# Patient Record
Sex: Female | Born: 1970 | Race: Black or African American | Hispanic: No | Smoking: Never smoker
Health system: Southern US, Community
[De-identification: ages and names within clinical notes are randomized; demographics above are authoritative.]

## PROBLEM LIST (undated history)

## (undated) DIAGNOSIS — K8050 Calculus of bile duct without cholangitis or cholecystitis without obstruction: Secondary | ICD-10-CM

## (undated) DIAGNOSIS — G35 Multiple sclerosis: Secondary | ICD-10-CM

## (undated) DIAGNOSIS — M1610 Unilateral primary osteoarthritis, unspecified hip: Secondary | ICD-10-CM

## (undated) DIAGNOSIS — N83209 Unspecified ovarian cyst, unspecified side: Secondary | ICD-10-CM

## (undated) DIAGNOSIS — R32 Unspecified urinary incontinence: Secondary | ICD-10-CM

## (undated) DIAGNOSIS — D6859 Other primary thrombophilia: Secondary | ICD-10-CM

## (undated) DIAGNOSIS — N920 Excessive and frequent menstruation with regular cycle: Secondary | ICD-10-CM

## (undated) DIAGNOSIS — I00 Rheumatic fever without heart involvement: Secondary | ICD-10-CM

## (undated) DIAGNOSIS — J45909 Unspecified asthma, uncomplicated: Secondary | ICD-10-CM

## (undated) DIAGNOSIS — D508 Other iron deficiency anemias: Secondary | ICD-10-CM

## (undated) DIAGNOSIS — J301 Allergic rhinitis due to pollen: Secondary | ICD-10-CM

## (undated) DIAGNOSIS — G894 Chronic pain syndrome: Secondary | ICD-10-CM

## (undated) DIAGNOSIS — D497 Neoplasm of unspecified behavior of endocrine glands and other parts of nervous system: Secondary | ICD-10-CM

## (undated) DIAGNOSIS — K5900 Constipation, unspecified: Secondary | ICD-10-CM

## (undated) DIAGNOSIS — G609 Hereditary and idiopathic neuropathy, unspecified: Secondary | ICD-10-CM

## (undated) DIAGNOSIS — N910 Primary amenorrhea: Secondary | ICD-10-CM

## (undated) DIAGNOSIS — E079 Disorder of thyroid, unspecified: Secondary | ICD-10-CM

## (undated) DIAGNOSIS — G379 Demyelinating disease of central nervous system, unspecified: Secondary | ICD-10-CM

## (undated) DIAGNOSIS — M069 Rheumatoid arthritis, unspecified: Secondary | ICD-10-CM

## (undated) DIAGNOSIS — D573 Sickle-cell trait: Secondary | ICD-10-CM

## (undated) DIAGNOSIS — G629 Polyneuropathy, unspecified: Secondary | ICD-10-CM

## (undated) DIAGNOSIS — G253 Myoclonus: Secondary | ICD-10-CM

## (undated) DIAGNOSIS — M1990 Unspecified osteoarthritis, unspecified site: Secondary | ICD-10-CM

## (undated) DIAGNOSIS — M3500 Sicca syndrome, unspecified: Secondary | ICD-10-CM

## (undated) DIAGNOSIS — E039 Hypothyroidism, unspecified: Secondary | ICD-10-CM

## (undated) DIAGNOSIS — K8020 Calculus of gallbladder without cholecystitis without obstruction: Secondary | ICD-10-CM

## (undated) DIAGNOSIS — Z96641 Presence of right artificial hip joint: Secondary | ICD-10-CM

## (undated) HISTORY — DX: Excessive and frequent menstruation with regular cycle: N92.0

## (undated) HISTORY — DX: Rheumatoid arthritis, unspecified: M06.9

## (undated) HISTORY — DX: Sickle-cell trait: D57.3

## (undated) HISTORY — DX: Myoclonus: G25.3

## (undated) HISTORY — DX: Other primary thrombophilia: D68.59

## (undated) HISTORY — DX: Hereditary and idiopathic neuropathy, unspecified: G60.9

## (undated) HISTORY — DX: Hypothyroidism, unspecified: E03.9

## (undated) HISTORY — DX: Rheumatic fever without heart involvement: I00

## (undated) HISTORY — DX: Presence of right artificial hip joint: Z96.641

## (undated) HISTORY — DX: Other iron deficiency anemias: D50.8

## (undated) HISTORY — DX: Calculus of gallbladder without cholecystitis without obstruction: K80.20

## (undated) HISTORY — DX: Unspecified urinary incontinence: R32

## (undated) HISTORY — PX: TUBAL LIGATION: SHX77

## (undated) HISTORY — DX: Unspecified asthma, uncomplicated: J45.909

## (undated) HISTORY — DX: Demyelinating disease of central nervous system, unspecified: G37.9

## (undated) HISTORY — DX: Chronic pain syndrome: G89.4

## (undated) HISTORY — DX: Unilateral primary osteoarthritis, unspecified hip: M16.10

## (undated) HISTORY — DX: Primary amenorrhea: N91.0

## (undated) HISTORY — DX: Allergic rhinitis due to pollen: J30.1

## (undated) HISTORY — DX: Neoplasm of unspecified behavior of endocrine glands and other parts of nervous system: D49.7

## (undated) HISTORY — DX: Constipation, unspecified: K59.00

## (undated) HISTORY — DX: Sjogren syndrome, unspecified: M35.00

---

## 2011-10-15 ENCOUNTER — Emergency Department (HOSPITAL_COMMUNITY)
Admission: EM | Admit: 2011-10-15 | Discharge: 2011-10-15 | Disposition: A | Discharge status: Home / Self Care | Payer: Medicare PPO | Attending: Emergency Medicine | Admitting: Emergency Medicine

## 2011-10-15 ENCOUNTER — Encounter (HOSPITAL_COMMUNITY): Payer: Self-pay | Admitting: *Deleted

## 2011-10-15 ENCOUNTER — Emergency Department (HOSPITAL_COMMUNITY): Payer: Medicare PPO

## 2011-10-15 DIAGNOSIS — H539 Unspecified visual disturbance: Secondary | ICD-10-CM

## 2011-10-15 DIAGNOSIS — G9389 Other specified disorders of brain: Secondary | ICD-10-CM | POA: Insufficient documentation

## 2011-10-15 DIAGNOSIS — H5340 Unspecified visual field defects: Secondary | ICD-10-CM | POA: Insufficient documentation

## 2011-10-15 DIAGNOSIS — M3500 Sicca syndrome, unspecified: Secondary | ICD-10-CM | POA: Insufficient documentation

## 2011-10-15 DIAGNOSIS — M069 Rheumatoid arthritis, unspecified: Secondary | ICD-10-CM | POA: Insufficient documentation

## 2011-10-15 DIAGNOSIS — H538 Other visual disturbances: Secondary | ICD-10-CM | POA: Insufficient documentation

## 2011-10-15 HISTORY — DX: Disorder of thyroid, unspecified: E07.9

## 2011-10-15 HISTORY — DX: Unspecified osteoarthritis, unspecified site: M19.90

## 2011-10-15 HISTORY — DX: Polyneuropathy, unspecified: G62.9

## 2011-10-15 LAB — POCT I-STAT, CHEM 8
Creatinine, Ser: 0.8 mg/dL (ref 0.50–1.10)
HCT: 37 % (ref 36.0–46.0)
Hemoglobin: 12.6 g/dL (ref 12.0–15.0)
Potassium: 3.9 mEq/L (ref 3.5–5.1)
Sodium: 144 mEq/L (ref 135–145)

## 2011-10-15 MED ORDER — GADOBENATE DIMEGLUMINE 529 MG/ML IV SOLN
20.0000 mL | Freq: Once | INTRAVENOUS | Status: AC | PRN
  Administered 2011-10-15: 20 mL via INTRAVENOUS

## 2011-10-15 NOTE — ED Notes (Signed)
Neurology at bedside.

## 2011-10-15 NOTE — ED Provider Notes (Signed)
Pt w/ h/o Sjogrens and RA presents w/ persistent L central vision loss, first noticed upon waking 5 days ago.  Has had vision changes in the past but usually resolve spontaneously.  Evaluated by ophtho today and referred to ER for MRI to look for extraocular etiology.  Pt A&O and in NAD.  Eating dinner.  MRI pending.  6:34 PM   Mr Laqueta Jean Wo Contrast  10/15/2011  *RADIOLOGY REPORT*  Clinical Data: The worst areas of right central field deficit.  The patient states she has a history of left visual field disturbances. She has been having blurred vision for 5 days.  MRI HEAD WITHOUT AND WITH CONTRAST  Technique:  Multiplanar, multiecho pulse sequences of the brain and surrounding structures were obtained according to standard protocol without and with intravenous contrast  Contrast: 20mL MULTIHANCE GADOBENATE DIMEGLUMINE 529 MG/ML IV SOLN  Comparison: None available.  Findings: A right periventricular white matter hyperintensity measures 8 mm.  There is a rounded T2 hyperintensity in the right middle cerebellar peduncle measuring the 6 mm.  A lacunar infarct of the right cerebellum is also evident. Incidental note is made of a persistent cavum septum pellucidum.  The postcontrast images demonstrate no pathologic enhancement. Specifically, the white matter lesions do not enhance.  The diffusion weighted images demonstrate some T2 shine through at both lesions.  No definite restricted effusion is evident.  No hemorrhage or mass lesion is present.  IMPRESSION:  1.  To separate T2 hyperintense lesions are present one is adjacent to the right lateral ventricle, the other is in the right middle cerebellar peduncle.  Given the patient's symptoms and demographics, these are most concerning for a demyelinating lesion. Consider multiple sclerosis.   However, the findings are nonspecific and can also be seen in the setting of chronic microvascular ischemia, vasculitis, complicated migraine headaches, or as the sequelae of a  prior infectious or inflammatory process. 2.  Focal lesion of the right cerebellum more posteriorly appears represent a remote lacunar infarct.  Original Report Authenticated By: Jamesetta Orleans. MATTERN, M.D.   Neurology consulted and had seen pt in ED.  They would like pt to follow up with [facility] w/in the next 7 days and contact her office if they will not see her during that timeframe.  Plan discussed w/ pt.    Otilio Miu, [place] 10/16/11 1329

## 2011-10-15 NOTE — Consult Note (Signed)
Reason for Consult:Vision changes Referring Physician: Bebe Shaggy  CC: Central vision loss in the left eye  HPI: Sara Hayes is an 41 y.o. female with a history of RA on Embrel who reports that on Saturday she awakened with changes in her vision on the left eye.  Sometimes she has blurry vision with the [facility] and decided to wait to see if it would go away.  Her vision problems were still there on Sunday and she began to call her doctors at that time.  They advised her to stop her Embrel.  Her symptoms continuedand she presented here for evaluation.  She describes central vision loss in th left eye.  MRI of the brain was performed and showed two demyelinating lesions.    Past Medical History  Diagnosis Date  . Neuropathy   . Thyroid disease   . Arthritis     History reviewed. No pertinent past surgical history.  No family history on file.  Social History:  does not have a smoking history on file. She does not have any smokeless tobacco history on file. Her alcohol and drug histories not on file.  Allergies  Allergen Reactions  . Aspirin Other (See Comments)    unknown  . Naproxen Other (See Comments)    unknown  . Other Other (See Comments)    Amoxicillin: unknown  . Penicillins Other (See Comments)    unknown    Medications: I have reviewed the patient's current medications. Prior to Admission:  Tegretol, Vitamin D, Folvite, Neurontin, Synthroid, Methotrexate, Singulair, MVI, Pamelor   ROS: History obtained from the patient  General ROS: negative for - chills, fatigue, fever, night sweats, weight gain or weight loss Psychological ROS: negative for - behavioral disorder, hallucinations, memory difficulties, mood swings or suicidal ideation Ophthalmic ROS: see HPI ENT ROS: negative for - epistaxis, nasal discharge, oral lesions, sore throat, tinnitus or vertigo Allergy and Immunology ROS: negative for - hives or itchy/watery eyes Hematological and Lymphatic ROS: negative  for - bleeding problems, bruising or swollen lymph nodes Endocrine ROS: negative for - galactorrhea, hair pattern changes, polydipsia/polyuria or temperature intolerance Respiratory ROS: negative for - cough, hemoptysis, shortness of breath or wheezing Cardiovascular ROS: negative for - chest pain, dyspnea on exertion, edema or irregular heartbeat Gastrointestinal ROS: negative for - abdominal pain, diarrhea, hematemesis, nausea/vomiting or stool incontinence Genito-Urinary ROS: negative for - dysuria, hematuria, incontinence or urinary frequency/urgency Musculoskeletal ROS: joint pain Neurological ROS: as noted in HPI Dermatological ROS: negative for rash and skin lesion changes Physical Examination: Blood pressure 125/82, pulse 78, temperature 97.6 F (36.4 C), temperature source Oral, resp. rate 18, SpO2 99.00%.  Neurologic Examination Mental Status: Alert, oriented, thought content appropriate.  Speech fluent without evidence of aphasia.  Able to follow 3 step commands without difficulty. Cranial Nerves: II: Pupils equal, round, reactive to light and accommodation III,IV, VI: ptosis not present, extra-ocular motions intact bilaterally V,VII: smile symmetric, facial light touch sensation normal bilaterally VIII: hearing normal bilaterally IX,X: gag reflex present XI: trapezius strength/neck flexion strength normal bilaterally XII: tongue strength normal  Motor: Right : Upper extremity   5/5    Left:     Upper extremity   5/5  Lower extremity   5/5     Lower extremity   5/5 Tone and bulk:normal tone throughout; no atrophy noted Sensory: Pinprick and light touch intact throughout, bilaterally Deep Tendon Reflexes: 1+ in the upper extremities and absent in the lower extremities Plantars: Right: downgoing   Left: downgoing Cerebellar:  normal finger-to-nose and normal heel-to-shin test  Results for orders placed during the hospital encounter of 10/15/11 (from the past 48 hour(s))    POCT I-STAT, CHEM 8     Status: Normal   Collection Time   10/15/11  4:52 PM      Component Value Range Comment   Sodium 144  135 - 145 (mEq/L)    Potassium 3.9  3.5 - 5.1 (mEq/L)    Chloride 102  96 - 112 (mEq/L)    BUN 7  6 - 23 (mg/dL)    Creatinine, Ser 4.09  0.50 - 1.10 (mg/dL)    Glucose, Bld 89  70 - 99 (mg/dL)    Calcium, Ion 8.11  1.12 - 1.32 (mmol/L)    TCO2 30  0 - 100 (mmol/L)    Hemoglobin 12.6  12.0 - 15.0 (g/dL)    HCT 91.4  78.2 - 95.6 (%)     No results found for this or any previous visit (from the past 240 hour(s)).  Mr Laqueta Jean Wo Contrast  10/15/2011  *RADIOLOGY REPORT*  Clinical Data: The worst areas of right central field deficit.  The patient states she has a history of left visual field disturbances. She has been having blurred vision for 5 days.  MRI HEAD WITHOUT AND WITH CONTRAST  Technique:  Multiplanar, multiecho pulse sequences of the brain and surrounding structures were obtained according to standard protocol without and with intravenous contrast  Contrast: 20mL MULTIHANCE GADOBENATE DIMEGLUMINE 529 MG/ML IV SOLN  Comparison: None available.  Findings: A right periventricular white matter hyperintensity measures 8 mm.  There is a rounded T2 hyperintensity in the right middle cerebellar peduncle measuring the 6 mm.  A lacunar infarct of the right cerebellum is also evident. Incidental note is made of a persistent cavum septum pellucidum.  The postcontrast images demonstrate no pathologic enhancement. Specifically, the white matter lesions do not enhance.  The diffusion weighted images demonstrate some T2 shine through at both lesions.  No definite restricted effusion is evident.  No hemorrhage or mass lesion is present.  IMPRESSION:  1.  To separate T2 hyperintense lesions are present one is adjacent to the right lateral ventricle, the other is in the right middle cerebellar peduncle.  Given the patient's symptoms and demographics, these are most concerning for a  demyelinating lesion. Consider multiple sclerosis.   However, the findings are nonspecific and can also be seen in the setting of chronic microvascular ischemia, vasculitis, complicated migraine headaches, or as the sequelae of a prior infectious or inflammatory process. 2.  Focal lesion of the right cerebellum more posteriorly appears represent a remote lacunar infarct.  Original Report Authenticated By: Jamesetta Orleans. MATTERN, M.D.     Assessment/Plan:  Patient Active Hospital Problem List: Visual changes   Assessment:  Patient with vision changes that are likely secondary to the demyelinating lesion on the MR imaging.  There have been reported cases on MS on Embrel and it is likely that this is what the patient is experiencing.  She has already been told to discontinue her Embrel.  Her symptoms have been present for the past 5 days and have not been progressive.  Her lesions are not enhancing.     Plan:  1. Patient may be followed up on an outpatient basis by Neurology.  Will need to be seen within a week.    Thana Farr, MD [facility] [phone] 10/15/2011, 9:53 PM

## 2011-10-15 NOTE — ED Notes (Signed)
Sent to ed from eye md's office for eval of grey spot in left eye

## 2011-10-15 NOTE — ED Provider Notes (Signed)
BP 129/89  Pulse 94  Temp(Src) 98.3 F (36.8 C) (Oral)  SpO2 98% Pt seen with resident Pt here with visual change in left eye, already seen by ophthalmology No other focal deficits reported/noted on exam Will obtain MRI with/without infusion per rads recommendation   Joya Gaskins, MD 10/15/11 1642

## 2011-10-15 NOTE — ED Notes (Signed)
MD at bedside. PT in EYE ROOM

## 2011-10-15 NOTE — ED Notes (Addendum)
Report received, assumed care. Pt ambulatory to CDU 3.

## 2011-10-15 NOTE — ED Provider Notes (Signed)
History     CSN: [id number]  Arrival date & time 10/15/11  1446   First MD Initiated Contact with Patient 10/15/11 1506      Chief Complaint  Patient presents with  . Eye Problem    (Consider location/radiation/quality/duration/timing/severity/associated sxs/prior treatment) HPI history from patient Patient sent from ophthalmology office for further evaluation and possible MRI  Patient is a 41 year old female with a history of rheumatoid arthritis and Sjogren's disease (on methotrexate and Enbrel) who presents with left vision change. This started approximately 4-5 days ago. It was abrupt in onset and has been constant. There been no modifying factors noted. Patient reports visual change as a loss of central vision. She describes it they film or covering that prevents her from seeing things straight ahead. She does not have specific upper field deficits. She has had no diplopia. She denies extremity weakness or sensation changes. She is still able to ambulate well at baseline. She has had no headache. She has also not had any recent fever, chills, other infectious symptoms. She has never had this problem before. She has been on Enbrel for about one year. No significant changes in her other medications. She saw ophthalmologist today who evaluated her. Per patient's report, ocular exam was normal. Transferred here for additional evaluation and possible MRI  To rule out extraocular etiology.  Overall severity moderate.  Past Medical History  Diagnosis Date  . Neuropathy   . Thyroid disease   . Arthritis     History reviewed. No pertinent past surgical history.  No family history on file.  History  Substance Use Topics  . Smoking status: Not on file  . Smokeless tobacco: Not on file  . Alcohol Use:     OB History    Grav Para Term Preterm Abortions TAB SAB Ect Mult Living                  Review of Systems  Constitutional: Negative for fever and chills.  HENT: Negative for  facial swelling.   Respiratory: Negative for cough, chest tightness, shortness of breath and wheezing.   Cardiovascular: Negative for chest pain.  Gastrointestinal: Negative for nausea, vomiting, abdominal pain and diarrhea.  Genitourinary: Negative for difficulty urinating.  Skin: Negative for rash.  Neurological: Negative for dizziness, speech difficulty, weakness, light-headedness, numbness and headaches.  Psychiatric/Behavioral: Negative for behavioral problems and confusion.  All other systems reviewed and are negative.    Allergies  Aspirin; Naproxen; Other; and Penicillins  Home Medications   Current Outpatient Rx  Name Route Sig Dispense Refill  . CARBAMAZEPINE 200 MG PO TABS Oral Take 200 mg by mouth 2 (two) times daily.    Marland Kitchen VITAMIN D PO Oral Take 1 tablet by mouth daily.    Marland Kitchen FOLIC ACID 1 MG PO TABS Oral Take 3 mg by mouth daily.    Marland Kitchen GABAPENTIN 300 MG PO CAPS Oral Take 900 mg by mouth 3 (three) times daily.    Marland Kitchen LEVOTHYROXINE SODIUM 100 MCG PO TABS Oral Take 100 mcg by mouth daily.    Marland Kitchen METHOTREXATE SODIUM 2.5 MG PO TABS Oral Take 25 mg by mouth 3 (three) times a week.    Marland Kitchen MONTELUKAST SODIUM 10 MG PO TABS Oral Take 10 mg by mouth at bedtime.    Carma Leaven M PLUS PO TABS Oral Take 1 tablet by mouth daily.    Marland Kitchen NORTRIPTYLINE HCL 25 MG PO CAPS Oral Take 75 mg by mouth at bedtime.  BP 118/60  Pulse 91  Temp(Src) 97.9 F (36.6 C) (Oral)  Resp 20  SpO2 100%  Physical Exam  Nursing note and vitals reviewed. Constitutional: She is oriented to person, place, and time. She appears well-developed and well-nourished. No distress.  HENT:  Head: Normocephalic.  Mouth/Throat: Oropharynx is clear and moist.  Eyes: EOM are normal. Pupils are equal, round, and reactive to light. No scleral icterus.  Neck: Normal range of motion. Neck supple.       No bruits  Cardiovascular: Normal rate, regular rhythm and intact distal pulses.   Pulmonary/Chest: Effort normal. No  respiratory distress. She has no wheezes. She has no rales.  Abdominal: Soft. She exhibits no distension. There is no tenderness.  Musculoskeletal: Normal range of motion. She exhibits no edema and no tenderness.  Neurological: She is alert and oriented to person, place, and time. She has normal strength. No cranial nerve deficit or sensory deficit. She exhibits normal muscle tone. Coordination normal. GCS eye subscore is 4. GCS verbal subscore is 5. GCS motor subscore is 6.       No pronator drift Normal ambulation  No specific visual field cut her deficit. Patient does have subjective central visual loss in left eye. Extraocular movements intact.  Cranial nerves all intact. Normal coordination.  Normal strength throughout  Skin: Skin is warm and dry. No rash noted. She is not diaphoretic.  Psychiatric: She has a normal mood and affect. Her behavior is normal. Thought content normal.    ED Course  Procedures (including critical care time)   Labs Reviewed  POCT I-STAT, CHEM 8  I-STAT, CHEM 8   Mr Laqueta Jean Wo Contrast  10/15/2011  *RADIOLOGY REPORT*  Clinical Data: The worst areas of right central field deficit.  The patient states she has a history of left visual field disturbances. She has been having blurred vision for 5 days.  MRI HEAD WITHOUT AND WITH CONTRAST  Technique:  Multiplanar, multiecho pulse sequences of the brain and surrounding structures were obtained according to standard protocol without and with intravenous contrast  Contrast: 20mL MULTIHANCE GADOBENATE DIMEGLUMINE 529 MG/ML IV SOLN  Comparison: None available.  Findings: A right periventricular white matter hyperintensity measures 8 mm.  There is a rounded T2 hyperintensity in the right middle cerebellar peduncle measuring the 6 mm.  A lacunar infarct of the right cerebellum is also evident. Incidental note is made of a persistent cavum septum pellucidum.  The postcontrast images demonstrate no pathologic enhancement.  Specifically, the white matter lesions do not enhance.  The diffusion weighted images demonstrate some T2 shine through at both lesions.  No definite restricted effusion is evident.  No hemorrhage or mass lesion is present.  IMPRESSION:  1.  To separate T2 hyperintense lesions are present one is adjacent to the right lateral ventricle, the other is in the right middle cerebellar peduncle.  Given the patient's symptoms and demographics, these are most concerning for a demyelinating lesion. Consider multiple sclerosis.   However, the findings are nonspecific and can also be seen in the setting of chronic microvascular ischemia, vasculitis, complicated migraine headaches, or as the sequelae of a prior infectious or inflammatory process. 2.  Focal lesion of the right cerebellum more posteriorly appears represent a remote lacunar infarct.  Original Report Authenticated By: Jamesetta Orleans. MATTERN, M.D.     1. Vision changes       MDM   Patient here with new onset neurologic deficit described as central vision loss in left  eye. She is transferred here from ophthalmology after reportedly having a normal ocular exam. She will need MRI. Because of concern for possible demyelinating disease, will obtain MRI with and without contrast. Patient does not have significant risk factors for stroke in her clinical picture is not suggestive of acute stroke. She otherwise is well appearing with unremarkable neuro exam.  Patient transferred to CDU with MRI pending. CDU provider will followup results and aid in final disposition.        Milus Glazier 10/16/11 0106

## 2011-10-15 NOTE — ED Notes (Signed)
Pt seen by eye Dr and sent here for further eval of pituitary tumor.

## 2011-10-15 NOTE — ED Notes (Signed)
Patient transported to MRI 

## 2011-10-15 NOTE — ED Notes (Signed)
Ordered Psychologist, forensic Regular

## 2011-10-16 NOTE — ED Provider Notes (Signed)
I have personally seen and examined the patient.  I have discussed the plan of care with the resident.  I have reviewed the documentation on PMH/FH/Soc. History.  I have reviewed the documentation of the resident and agree.   Joya Gaskins, MD 10/16/11 2356

## 2011-10-16 NOTE — ED Provider Notes (Signed)
Medical screening examination/treatment/procedure(s) were performed by non-physician practitioner and as supervising physician I was immediately available for consultation/collaboration.   Joya Gaskins, MD 10/16/11 2356

## 2012-06-03 ENCOUNTER — Other Ambulatory Visit: Payer: Self-pay | Admitting: Internal Medicine

## 2012-06-03 DIAGNOSIS — Z1231 Encounter for screening mammogram for malignant neoplasm of breast: Secondary | ICD-10-CM

## 2012-06-18 ENCOUNTER — Ambulatory Visit: Payer: Medicare PPO

## 2013-02-07 ENCOUNTER — Emergency Department (HOSPITAL_COMMUNITY): Payer: Medicare PPO

## 2013-02-07 ENCOUNTER — Encounter (HOSPITAL_COMMUNITY): Payer: Self-pay | Admitting: *Deleted

## 2013-02-07 ENCOUNTER — Emergency Department (HOSPITAL_COMMUNITY)
Admission: EM | Admit: 2013-02-07 | Discharge: 2013-02-07 | Disposition: A | Discharge status: Home / Self Care | Payer: Medicare PPO | Attending: Emergency Medicine | Admitting: Emergency Medicine

## 2013-02-07 DIAGNOSIS — Z8742 Personal history of other diseases of the female genital tract: Secondary | ICD-10-CM | POA: Insufficient documentation

## 2013-02-07 DIAGNOSIS — Z880 Allergy status to penicillin: Secondary | ICD-10-CM | POA: Insufficient documentation

## 2013-02-07 DIAGNOSIS — G589 Mononeuropathy, unspecified: Secondary | ICD-10-CM | POA: Insufficient documentation

## 2013-02-07 DIAGNOSIS — Z975 Presence of (intrauterine) contraceptive device: Secondary | ICD-10-CM | POA: Insufficient documentation

## 2013-02-07 DIAGNOSIS — M129 Arthropathy, unspecified: Secondary | ICD-10-CM | POA: Insufficient documentation

## 2013-02-07 DIAGNOSIS — R102 Pelvic and perineal pain: Secondary | ICD-10-CM

## 2013-02-07 DIAGNOSIS — E079 Disorder of thyroid, unspecified: Secondary | ICD-10-CM | POA: Insufficient documentation

## 2013-02-07 DIAGNOSIS — R1032 Left lower quadrant pain: Secondary | ICD-10-CM | POA: Insufficient documentation

## 2013-02-07 DIAGNOSIS — Z79899 Other long term (current) drug therapy: Secondary | ICD-10-CM | POA: Insufficient documentation

## 2013-02-07 DIAGNOSIS — Z3202 Encounter for pregnancy test, result negative: Secondary | ICD-10-CM | POA: Insufficient documentation

## 2013-02-07 DIAGNOSIS — N949 Unspecified condition associated with female genital organs and menstrual cycle: Secondary | ICD-10-CM | POA: Insufficient documentation

## 2013-02-07 HISTORY — DX: Unspecified ovarian cyst, unspecified side: N83.209

## 2013-02-07 LAB — CBC WITH DIFFERENTIAL/PLATELET
Hemoglobin: 11.7 g/dL — ABNORMAL LOW (ref 12.0–15.0)
Lymphocytes Relative: 26 % (ref 12–46)
Lymphs Abs: 1.5 10*3/uL (ref 0.7–4.0)
MCH: 29.8 pg (ref 26.0–34.0)
Monocytes Relative: 11 % (ref 3–12)
Neutro Abs: 3.7 10*3/uL (ref 1.7–7.7)
Neutrophils Relative %: 63 % (ref 43–77)
RBC: 3.92 MIL/uL (ref 3.87–5.11)
WBC: 5.8 10*3/uL (ref 4.0–10.5)

## 2013-02-07 LAB — URINALYSIS, ROUTINE W REFLEX MICROSCOPIC
Bilirubin Urine: NEGATIVE
Hgb urine dipstick: NEGATIVE
Specific Gravity, Urine: 1.017 (ref 1.005–1.030)
pH: 6 (ref 5.0–8.0)

## 2013-02-07 LAB — URINE MICROSCOPIC-ADD ON

## 2013-02-07 LAB — POCT PREGNANCY, URINE: Preg Test, Ur: NEGATIVE

## 2013-02-07 LAB — WET PREP, GENITAL
Trich, Wet Prep: NONE SEEN
Yeast Wet Prep HPF POC: NONE SEEN

## 2013-02-07 LAB — BASIC METABOLIC PANEL
BUN: 7 mg/dL (ref 6–23)
CO2: 32 mEq/L (ref 19–32)
Chloride: 104 mEq/L (ref 96–112)
Glucose, Bld: 99 mg/dL (ref 70–99)
Potassium: 4.2 mEq/L (ref 3.5–5.1)

## 2013-02-07 MED ORDER — CEFTRIAXONE SODIUM 250 MG IJ SOLR
250.0000 mg | Freq: Once | INTRAMUSCULAR | Status: AC
  Administered 2013-02-07: 250 mg via INTRAMUSCULAR
  Filled 2013-02-07: qty 250

## 2013-02-07 MED ORDER — DOXYCYCLINE HYCLATE 100 MG PO CAPS: 100.0000 mg | ORAL_CAPSULE | Freq: Two times a day (BID) | ORAL | Status: DC

## 2013-02-07 MED ORDER — ACETAMINOPHEN 325 MG PO TABS
975.0000 mg | ORAL_TABLET | Freq: Once | ORAL | Status: AC
  Administered 2013-02-07: 975 mg via ORAL
  Filled 2013-02-07: qty 3

## 2013-02-07 MED ORDER — HYDROCODONE-ACETAMINOPHEN 5-325 MG PO TABS: 1.0000 | ORAL_TABLET | Freq: Four times a day (QID) | ORAL | Status: DC | PRN

## 2013-02-07 MED ORDER — LIDOCAINE HCL (PF) 1 % IJ SOLN
INTRAMUSCULAR | Status: AC
  Administered 2013-02-07: 16:00:00
  Filled 2013-02-07: qty 5

## 2013-02-07 NOTE — ED Notes (Signed)
Pt is here with LLQ pain since 0400 and pt reports it happened again last nite

## 2013-02-07 NOTE — ED Provider Notes (Signed)
History     CSN: [id number]  Arrival date & time 02/07/13  1119   First MD Initiated Contact with Patient 02/07/13 1210      Chief Complaint  Patient presents with  . Abdominal Pain    (Consider location/radiation/quality/duration/timing/severity/associated sxs/prior treatment) HPI Patient is a 41 y.o AAF who presents to emergency department complaining of left lower abdominal pain.  Patient states the pain is sharp and constant.  Pain began last night but subsided after a short time.  This morning around 0400 the pain returned and has progressively worsened.  The patient denies fever, nausea, vomiting, diarrhea, dysuria, and hematuria.  Patient has IUD and noticed some spotting yesterday (this is normal for her) but no other vaginal bleeding.  She uses condoms and denies vaginal discharge.  She has an ovarian cyst but does not remember on which side it is located.  She has taken tylenol with minimal relief.   Past Medical History  Diagnosis Date  . Neuropathy   . Thyroid disease   . Arthritis   . Ovarian cyst     Past Surgical History  Procedure Laterality Date  . Tubal ligation      No family history on file.  History  Substance Use Topics  . Smoking status: Never Smoker   . Smokeless tobacco: Not on file  . Alcohol Use: No    OB History   Grav Para Term Preterm Abortions TAB SAB Ect Mult Living                  Review of Systems All other systems negative except as documented in the HPI. All pertinent positives and negatives as reviewed in the HPI.  Allergies  Aspirin; Naproxen; Other; and Penicillins  Home Medications   Current Outpatient Rx  Name  Route  Sig  Dispense  Refill  . carbamazepine (TEGRETOL) 200 MG tablet   Oral   Take 200 mg by mouth 2 (two) times daily.         . Cholecalciferol (VITAMIN D PO)   Oral   Take 1 tablet by mouth daily.         . folic acid (FOLVITE) 1 MG tablet   Oral   Take 3 mg by mouth daily.         Marland Kitchen  gabapentin (NEURONTIN) 300 MG capsule   Oral   Take 900 mg by mouth 3 (three) times daily.         Marland Kitchen levothyroxine (SYNTHROID, LEVOTHROID) 100 MCG tablet   Oral   Take 100 mcg by mouth daily.         . methotrexate 2.5 MG tablet   Oral   Take 25 mg by mouth 3 (three) times a week.         . montelukast (SINGULAIR) 10 MG tablet   Oral   Take 10 mg by mouth at bedtime.         . Multiple Vitamins-Minerals (MULTIVITAMINS THER. W/MINERALS) TABS   Oral   Take 1 tablet by mouth daily.         . nortriptyline (PAMELOR) 25 MG capsule   Oral   Take 75 mg by mouth at bedtime.           BP 130/80  Pulse 94  Temp(Src) 97.9 F (36.6 C) (Oral)  Resp 18  SpO2 100%  Physical Exam  Constitutional: She is oriented to person, place, and time. She appears well-developed and well-nourished.  HENT:  Head: Normocephalic  and atraumatic.  Mouth/Throat: Oropharynx is clear and moist.  Eyes: Conjunctivae are normal. Pupils are equal, round, and reactive to light.  Neck: Neck supple.  Cardiovascular: Normal rate, regular rhythm and normal heart sounds.  Exam reveals no friction rub.   No murmur heard. Pulmonary/Chest: Effort normal and breath sounds normal. No respiratory distress. She has no wheezes. She has no rales.  Abdominal: Soft. Bowel sounds are normal. There is tenderness in the left lower quadrant. There is guarding. There is no CVA tenderness.    Lymphadenopathy:    She has no cervical adenopathy.  Neurological: She is alert and oriented to person, place, and time.  Skin: Skin is warm and dry.  Psychiatric: She has a normal mood and affect.    ED Course  Procedures (including critical care time)  Labs Reviewed  CBC WITH DIFFERENTIAL - Abnormal; Notable for the following:    Hemoglobin 11.7 (*)    HCT 34.3 (*)    All other components within normal limits  URINALYSIS, ROUTINE W REFLEX MICROSCOPIC - Abnormal; Notable for the following:    Leukocytes, UA SMALL (*)     All other components within normal limits  URINE MICROSCOPIC-ADD ON - Abnormal; Notable for the following:    Squamous Epithelial / LPF FEW (*)    All other components within normal limits  BASIC METABOLIC PANEL  POCT PREGNANCY, URINE   Patient treated for possible STD.  Patient is referred back to her GYN doctor for further evaluation and care.  She told to return to the emergency department for any worsening in her condition  MDM          Carlyle Dolly, PA-C 02/11/13 0011

## 2013-02-07 NOTE — ED Notes (Signed)
Pain LLQ since yesterday. Hx of cyst, but unsure what side. Denies nausea/vomiting/diarrhea. No fever/chills. No vaginal discharge, but has had some intermittent spotting. Tender to LLQ on palpation. Took tylenol this am with some relief.

## 2013-02-08 LAB — GC/CHLAMYDIA PROBE AMP
CT Probe RNA: NEGATIVE
GC Probe RNA: NEGATIVE

## 2013-02-11 NOTE — ED Provider Notes (Signed)
Medical screening examination/treatment/procedure(s) were performed by non-physician practitioner and as supervising physician I was immediately available for consultation/collaboration.   Gilda Crease, MD 02/11/13 [phone]

## 2013-09-13 ENCOUNTER — Other Ambulatory Visit: Payer: Self-pay

## 2013-09-13 DIAGNOSIS — Z1231 Encounter for screening mammogram for malignant neoplasm of breast: Secondary | ICD-10-CM

## 2014-04-12 IMAGING — US US ART/VEN ABD/PELV/SCROTUM DOPPLER LTD
1 series · 13 of 25 positions shown · non-contrast
Comparison: None.

CLINICAL DATA: Left lower quadrant pelvic pain, clinical concern
for torsion

TRANSABDOMINAL AND TRANSVAGINAL ULTRASOUND OF PELVIS
DOPPLER ULTRASOUND OF OVARIES
TECHNIQUE: Both transabdominal and transvaginal ultrasound
examinations of the pelvis were performed. Transabdominal technique
was performed for global imaging of the pelvis including uterus,
ovaries, adnexal regions, and pelvic cul-de-sac.
It was necessary to proceed with endovaginal exam following the
transabdominal exam to visualize the endometrium and ovaries.
Color and duplex Doppler ultrasound was utilized to evaluate blood
flow to the ovaries.

[Series 1: us art/ven abd/pelv/scrotum doppler ltd · 0.28mm/px · 13 of 94 slices shown]
[im 1/94]
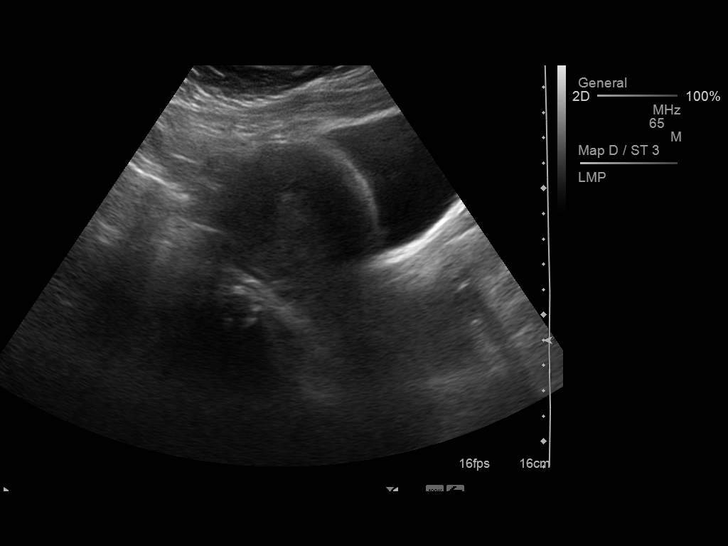
[im 8/94]
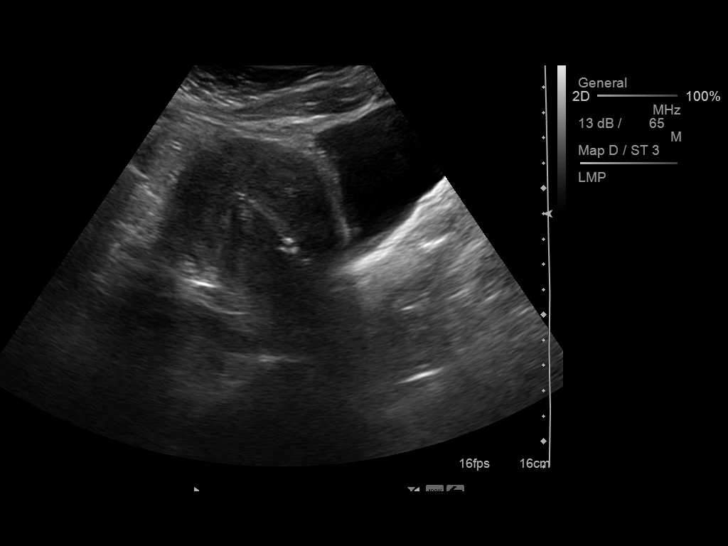
[im 16/94]
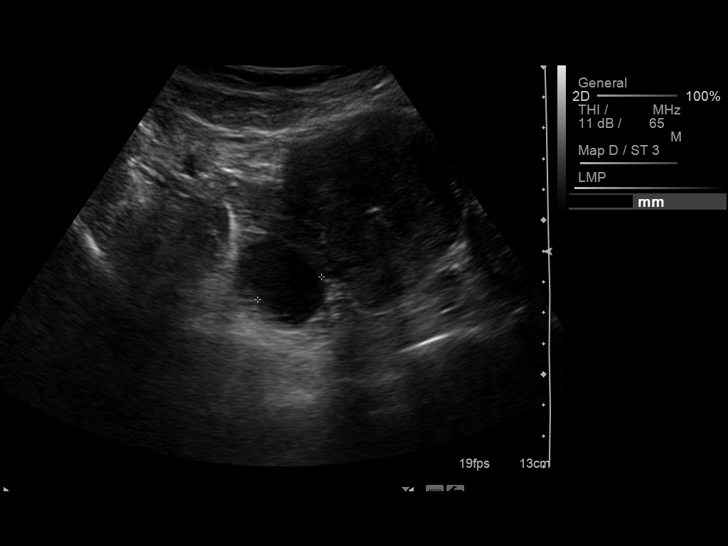
[im 24/94]
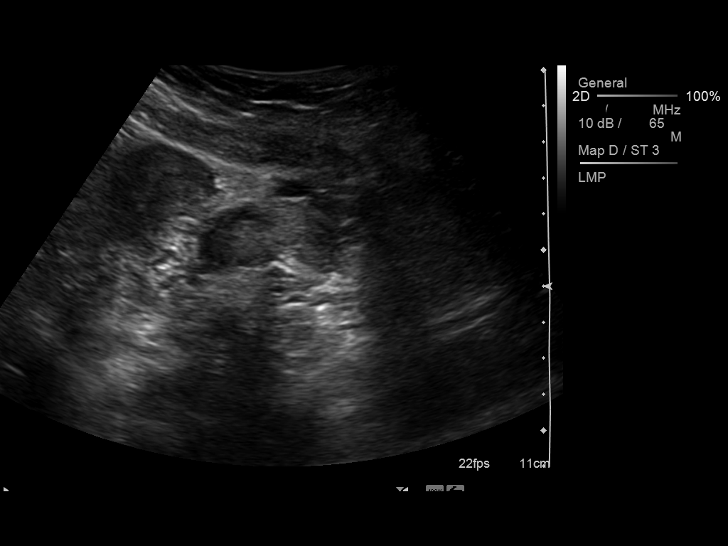
[im 32/94]
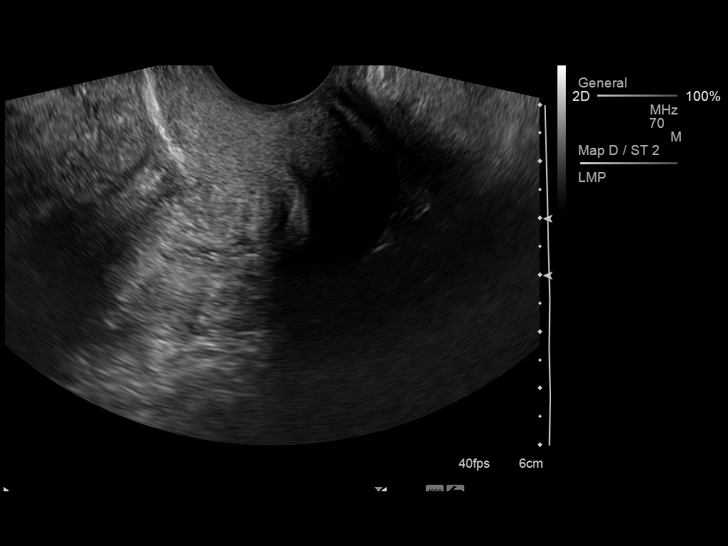
[im 39/94]
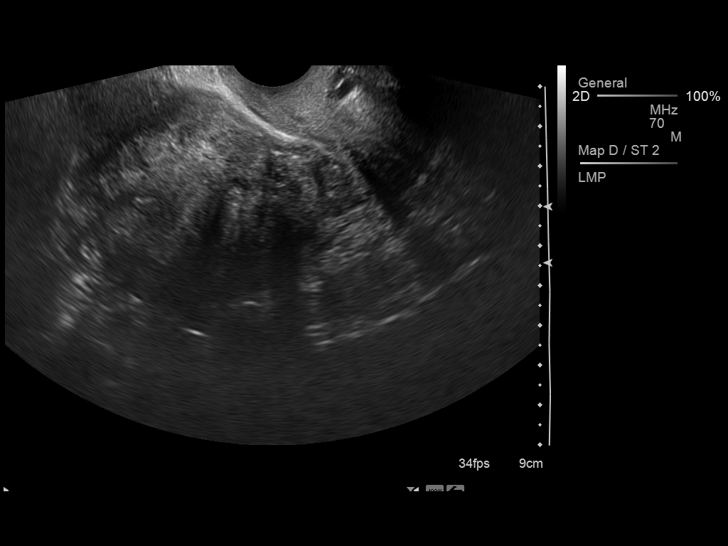
[im 47/94]
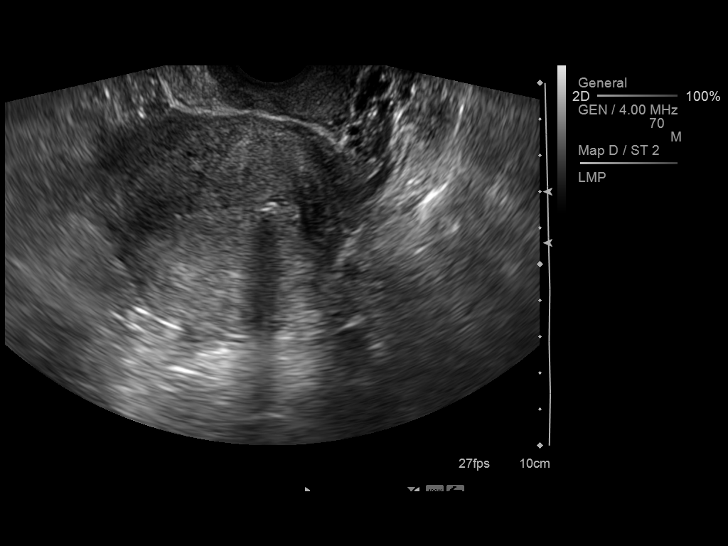
[im 55/94]
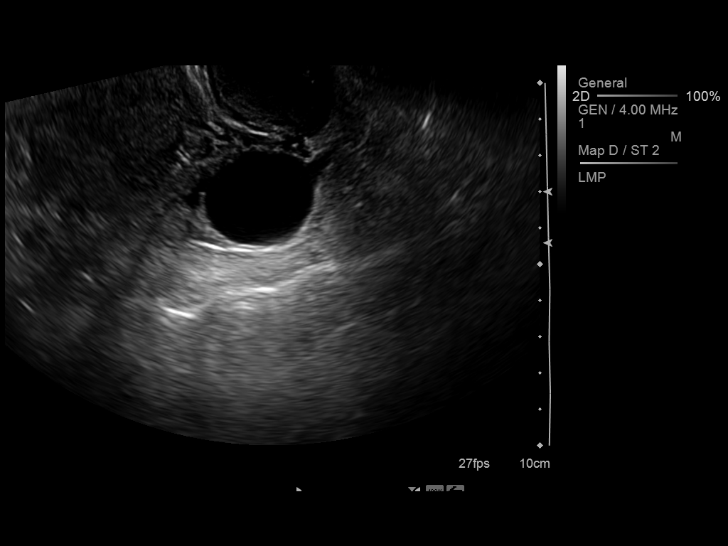
[im 63/94]
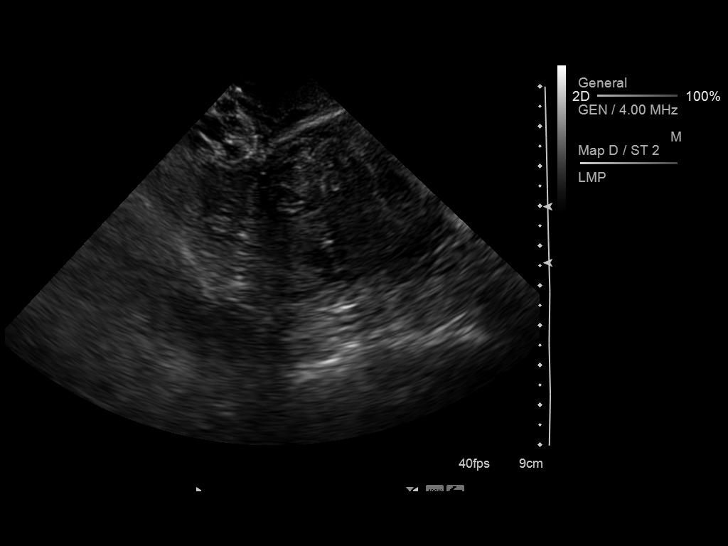
[im 70/94]
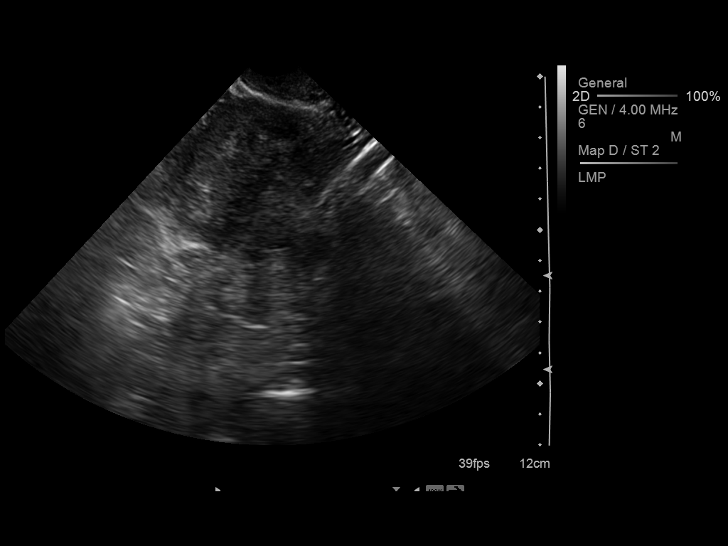
[im 78/94]
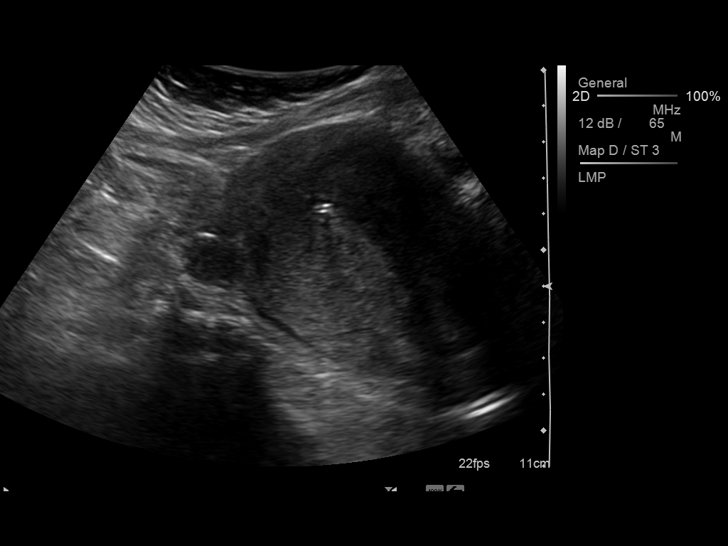
[im 86/94]
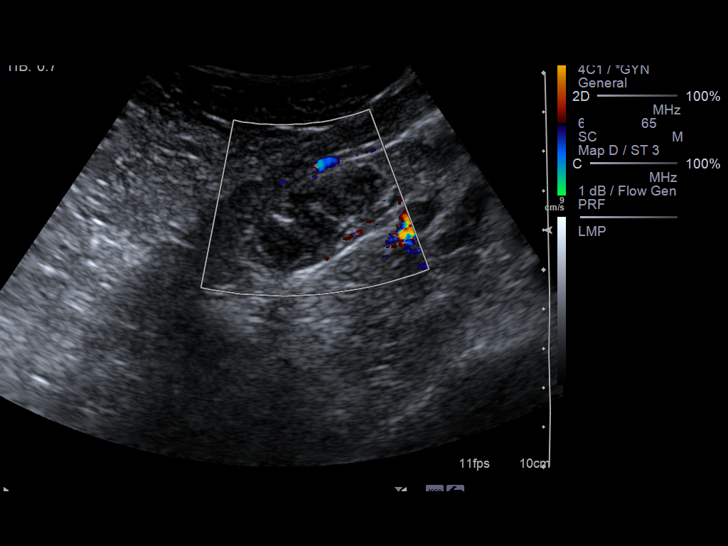
[im 94/94]
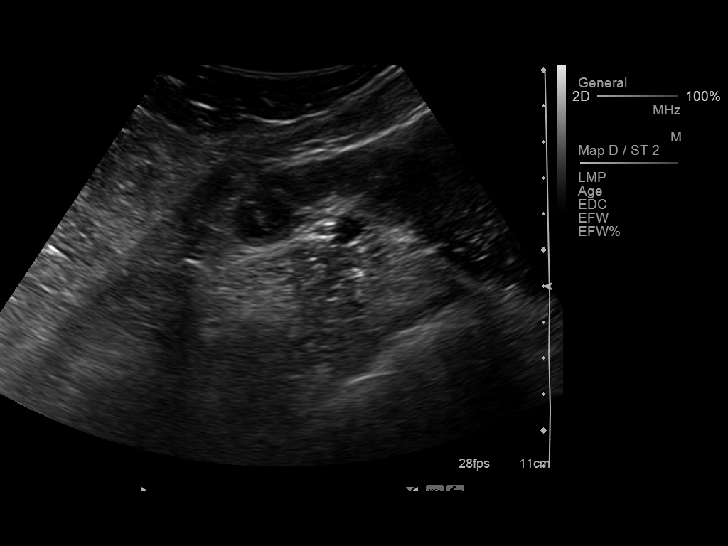

[13 of 25 positions shown; findings below may reference images not displayed]

FINDINGS: Uterus:  11.4 x 6.6 x 5.6 cm.  Anteverted, anteflexed.  No focal
abnormality.

Endometrium:  6 mm, uniformly thin where visualized.  Partly
obscured by IUD appropriately located within the uterine
fundal/body endometrial canal.

Right ovary: 4.5 x 2.4 x 2.8 cm.  Simple appearing cyst measures
3.4 x 2.9 x 2.4 cm.

Left ovary:   2.5 x 2.4 x 1.4 cm.  Normal. 1.8 x 1.4 x 1.2 cm
tubular anechoic left adnexal structure likely represents a
hydrosalpinx.  This is not well visualized, and a paraovarian cyst
could have a similar appearance.

Pulsed Doppler evaluation demonstrates normal low-resistance
arterial and venous waveforms in both ovaries.
IMPRESSION: No acute abnormality.  Possible small left hydrosalpinx or
paraovarian cyst incidentally noted.  Is there history of pelvic
inflammatory disease that could suggest tubo-ovarian abscess?

No sonographic evidence for ovarian torsion.

## 2014-08-15 ENCOUNTER — Other Ambulatory Visit (HOSPITAL_COMMUNITY): Payer: Self-pay | Admitting: Respiratory Therapy

## 2014-08-15 DIAGNOSIS — J45909 Unspecified asthma, uncomplicated: Secondary | ICD-10-CM

## 2014-08-18 ENCOUNTER — Other Ambulatory Visit: Payer: Self-pay

## 2014-08-18 DIAGNOSIS — Z1231 Encounter for screening mammogram for malignant neoplasm of breast: Secondary | ICD-10-CM

## 2014-08-22 ENCOUNTER — Ambulatory Visit (HOSPITAL_COMMUNITY)
Admission: RE | Admit: 2014-08-22 | Discharge: 2014-08-22 | Disposition: A | Discharge status: Home / Self Care | Payer: Medicare PPO | Source: Ambulatory Visit | Attending: Internal Medicine | Admitting: Internal Medicine

## 2014-08-22 DIAGNOSIS — J45909 Unspecified asthma, uncomplicated: Secondary | ICD-10-CM | POA: Diagnosis not present

## 2014-08-22 LAB — PULMONARY FUNCTION TEST
DL/VA % PRED: 116 %
DL/VA: 5.87 ml/min/mmHg/L
DLCO COR: 24.01 ml/min/mmHg
DLCO UNC % PRED: 89 %
DLCO cor % pred: 89 %
DLCO unc: 24.01 ml/min/mmHg
FEF 25-75 Pre: 4.02 L/sec
FEF2575-%Pred-Pre: 139 %
FEV1-%PRED-PRE: 101 %
FEV1-PRE: 2.72 L
FEV1FVC-%Pred-Pre: 108 %
FEV6-%PRED-PRE: 94 %
FEV6-Pre: 3.02 L
FEV6FVC-%PRED-PRE: 102 %
FVC-%Pred-Pre: 92 %
FVC-Pre: 3.02 L
PRE FEV1/FVC RATIO: 90 %
Pre FEV6/FVC Ratio: 100 %

## 2014-09-08 ENCOUNTER — Ambulatory Visit
Admission: RE | Admit: 2014-09-08 | Discharge: 2014-09-08 | Disposition: A | Discharge status: Home / Self Care | Payer: Medicare PPO | Source: Ambulatory Visit

## 2014-09-08 DIAGNOSIS — Z1231 Encounter for screening mammogram for malignant neoplasm of breast: Secondary | ICD-10-CM

## 2015-03-22 ENCOUNTER — Other Ambulatory Visit: Payer: Self-pay | Admitting: Internal Medicine

## 2015-03-22 ENCOUNTER — Ambulatory Visit
Admission: RE | Admit: 2015-03-22 | Discharge: 2015-03-22 | Disposition: A | Discharge status: Home / Self Care | Payer: Commercial Managed Care - HMO | Source: Ambulatory Visit | Attending: Internal Medicine | Admitting: Internal Medicine

## 2015-03-22 DIAGNOSIS — M542 Cervicalgia: Secondary | ICD-10-CM

## 2015-03-22 DIAGNOSIS — M546 Pain in thoracic spine: Secondary | ICD-10-CM

## 2015-05-08 ENCOUNTER — Other Ambulatory Visit: Payer: Self-pay | Admitting: Nurse Practitioner

## 2015-05-08 ENCOUNTER — Other Ambulatory Visit (HOSPITAL_COMMUNITY)
Admission: RE | Admit: 2015-05-08 | Discharge: 2015-05-08 | Disposition: A | Discharge status: Home / Self Care | Payer: Commercial Managed Care - HMO | Source: Ambulatory Visit | Attending: Nurse Practitioner | Admitting: Nurse Practitioner

## 2015-05-08 DIAGNOSIS — Z1151 Encounter for screening for human papillomavirus (HPV): Secondary | ICD-10-CM | POA: Diagnosis present

## 2015-05-08 DIAGNOSIS — Z113 Encounter for screening for infections with a predominantly sexual mode of transmission: Secondary | ICD-10-CM | POA: Diagnosis present

## 2015-05-08 DIAGNOSIS — Z124 Encounter for screening for malignant neoplasm of cervix: Secondary | ICD-10-CM | POA: Diagnosis present

## 2015-05-08 DIAGNOSIS — R87810 Cervical high risk human papillomavirus (HPV) DNA test positive: Secondary | ICD-10-CM | POA: Diagnosis present

## 2015-05-09 LAB — CYTOLOGY - PAP

## 2015-09-13 ENCOUNTER — Other Ambulatory Visit: Payer: Self-pay

## 2015-09-13 DIAGNOSIS — Z1231 Encounter for screening mammogram for malignant neoplasm of breast: Secondary | ICD-10-CM

## 2015-10-11 ENCOUNTER — Ambulatory Visit
Admission: RE | Admit: 2015-10-11 | Discharge: 2015-10-11 | Disposition: A | Discharge status: Home / Self Care | Payer: Commercial Managed Care - HMO | Source: Ambulatory Visit

## 2015-10-11 ENCOUNTER — Other Ambulatory Visit: Payer: Self-pay

## 2015-10-11 DIAGNOSIS — Z1231 Encounter for screening mammogram for malignant neoplasm of breast: Secondary | ICD-10-CM

## 2016-05-22 ENCOUNTER — Other Ambulatory Visit (HOSPITAL_COMMUNITY)
Admission: RE | Admit: 2016-05-22 | Discharge: 2016-05-22 | Disposition: A | Discharge status: Home / Self Care | Payer: Medicare Other | Source: Ambulatory Visit | Attending: Nurse Practitioner | Admitting: Nurse Practitioner

## 2016-05-22 ENCOUNTER — Other Ambulatory Visit: Payer: Self-pay | Admitting: Nurse Practitioner

## 2016-05-22 DIAGNOSIS — Z1151 Encounter for screening for human papillomavirus (HPV): Secondary | ICD-10-CM | POA: Insufficient documentation

## 2016-05-22 DIAGNOSIS — Z113 Encounter for screening for infections with a predominantly sexual mode of transmission: Secondary | ICD-10-CM | POA: Insufficient documentation

## 2016-05-22 DIAGNOSIS — Z01411 Encounter for gynecological examination (general) (routine) with abnormal findings: Secondary | ICD-10-CM | POA: Diagnosis present

## 2016-05-26 LAB — CYTOLOGY - PAP

## 2016-06-19 ENCOUNTER — Other Ambulatory Visit: Payer: Self-pay | Admitting: Nurse Practitioner

## 2016-09-30 ENCOUNTER — Other Ambulatory Visit: Payer: Self-pay | Admitting: Internal Medicine

## 2016-09-30 ENCOUNTER — Ambulatory Visit
Admission: RE | Admit: 2016-09-30 | Discharge: 2016-09-30 | Disposition: A | Discharge status: Home / Self Care | Payer: Medicare Other | Source: Ambulatory Visit | Attending: Internal Medicine | Admitting: Internal Medicine

## 2016-09-30 DIAGNOSIS — M25561 Pain in right knee: Secondary | ICD-10-CM

## 2016-11-25 ENCOUNTER — Encounter: Payer: Self-pay | Admitting: *Deleted

## 2016-11-26 ENCOUNTER — Encounter: Payer: Self-pay | Admitting: Diagnostic Neuroimaging

## 2016-11-26 ENCOUNTER — Ambulatory Visit (INDEPENDENT_AMBULATORY_CARE_PROVIDER_SITE_OTHER): Payer: Medicare Other | Admitting: Diagnostic Neuroimaging

## 2016-11-26 VITALS — BP 94/63 | HR 81 | Ht 67.0 in | Wt 209.0 lb

## 2016-11-26 DIAGNOSIS — M3509 Sicca syndrome with other organ involvement: Secondary | ICD-10-CM

## 2016-11-26 DIAGNOSIS — M79604 Pain in right leg: Secondary | ICD-10-CM

## 2016-11-26 DIAGNOSIS — G63 Polyneuropathy in diseases classified elsewhere: Secondary | ICD-10-CM | POA: Insufficient documentation

## 2016-11-26 DIAGNOSIS — M3506 Sjogren syndrome with peripheral nervous system involvement: Secondary | ICD-10-CM | POA: Insufficient documentation

## 2016-11-26 DIAGNOSIS — M62838 Other muscle spasm: Secondary | ICD-10-CM | POA: Diagnosis not present

## 2016-11-26 NOTE — Progress Notes (Signed)
[facility]  PATIENT: Sara Hayes DOB: [birth year]  REFERRING CLINICIAN: Lorenda Ishihara HISTORY FROM: patient REASON FOR VISIT: new consult    HISTORICAL  CHIEF COMPLAINT:  Chief Complaint  Patient presents with  . Bilateral leg weakness    rm 7, New Pt    HISTORY OF PRESENT ILLNESS:   NEW HPI (Dr. Marjory Lies, 11/26/16): 46 year old female with history of Sjgren syndrome with neurologic manifestations, here for evaluation second opinion. In 1980 patient was diagnosed with juvenile rheumatoid arthritis and rheumatic fever. She was treated with 2 months of some type of medication but then this was discontinued by patient's mother who opted to "turn to the lord" for healing the patient. By 2004 patient was having increasing numbness, tingling, weakness in her legs. She was found to have positive Sjogren's antibodies and started on Plaquenil. By 2008 she was evaluated at [facility] and her medications were continued to be adjusted. In 2013 patient developed left optic neuritis and was evaluated by Dr. Terrace Arabia. She then continued to follow-up at [facility] by multiple sclerosis and autoimmune specialist Dr. Bluford Kaufmann. Currently she is being managed by neurology and rheumatology at [facility]. Patient lives in [place] and due to difficulty in travel, patient is looking to establish with local neurologist again. Patient continues to have intermittent muscle jerks in her left shoulder and neck. Patient continues to have diffuse pain in her joints.  UPDATE (Dr. Bluford Kaufmann, [facility], 04/25/16): [I reviewed notes from 04/23/15 and 04/25/16: In summary Dr. Bluford Kaufmann reports that patient is 46 years old and history of arthritis and Sjogren's syndrome, who developed left optic neuritis on D10 or sept, status post skin biopsy demonstrates small fiber neuropathy, and CSF testing showing 3 oligoclonal bands. Patient was being treated by rheumatology for Sjogren's syndrome with CNS and PNS manifestations. Neurologist Dr. Bluford Kaufmann was  managing myoclonus symptoms with clonazepam.]  UPDATE (Dr. Terrace Arabia, 11/13/11): Visual evoked response test study on the right was normal. Study on the left showed no response. This study would be consistent with a significant lesion in the anterior visual pathway on the left, consistent with a severe optic neuritis or optic neuropathy. Poor patient cooperation may also produce no response. Clinical correlation is required. Lab showed +ANA, SSA/B, MRI of cervical showed degenerative disc disease, but no cord signal change, she was treated with IV steroids followed by p.o. prednisone tapering, now she reported 70% improvement of her left vision, still intermittent blurring, she was able to read fine print today, she continued to complain bilateral feet paresthesia. I have reviewed MRI brain again, there was two typical oval-shaped lesions, one is at periventricular region, the other is at right middle cerebellar peduncle, with her left optic neuritis, 2/4 lesions disseminated in space, the clinical isolated syndrome, likely progress to relapsing remitting multiple sclerosis, I have suggested lumbar puncture to look for oligoclonal banding, but she wants to talk with her neurologist in [place] before proceeding further, she is also under the her rheumatologist care and [place]  PRIOR HPI (Dr. Terrace Arabia, 10/17/11): 46 yo right-handed [place] American female, by neuro-hospitalist Dr. Creed Copper for evaluation of probable left optic neuritis. She has past medical history of rheumatoid arthritis, has been treated with Enbrel for one year, plaquenil 200 mg b.i.d. since 2004, and also methotrexate 2.5 mg 10 tablets every Thursday for 3 years, she stopped plaquenil 200mg  bid about 2 months ago. Rheumatologist are at [facility]. In January 5th, 2013, she woke up noticed blurry vision, when she closed her right eye, she  noticed dark grayness in the central and upper temporal part of her left eye, symptom has been persistent in  the past few days, she was evaluated by Dr. Maxwell Caul ophthalmologist, no structure lesion found, there was left upper temporal visual deficit enlarged central scotoma on visual field testing. She was later referred to emergency room, seen by Dr. Creed Copper, MRI of the brain with and without contrast has demonstrate to oval-shaped T2 and flare hyperdensity relations at the right periventricular, right middle cerebellar peduncle,, no contrast enhancement. She is referred for evaluation of possible multiple sclerosis.   REVIEW OF SYSTEMS: Full 14 system review of systems performed and negative with exception of: Memory loss numbness weakness restless legs joint pain joint swelling dry eyes dry mouth.  ALLERGIES: Allergies  Allergen Reactions  . Aspirin Hives  . Naproxen Other (See Comments)    Unknown, large blood clots  . Niacin And Related Other (See Comments)    flushing  . Other Hives  . Penicillins Hives    HOME MEDICATIONS: Outpatient Medications Prior to Visit  Medication Sig Dispense Refill  . Calcium Carbonate-Vitamin D (CALCIUM 600 + D PO) Take 2 tablets by mouth daily. Chocolate chewable    . carbamazepine (TEGRETOL) 200 MG tablet Take 200 mg by mouth 2 (two) times daily.    . cycloSPORINE (RESTASIS) 0.05 % ophthalmic emulsion Place 1 drop into both eyes 2 (two) times daily.    . folic acid (FOLVITE) 1 MG tablet Take 3 mg by mouth daily.    Marland Kitchen gabapentin (NEURONTIN) 300 MG capsule Take 900-1,200 mg by mouth 3 (three) times daily. 900 mg in am and midday, and 1200 mg at night    . hydroxychloroquine (PLAQUENIL) 200 MG tablet Take 200 mg by mouth daily.    . methotrexate 2.5 MG tablet Take 25 mg by mouth once a week.     . montelukast (SINGULAIR) 10 MG tablet Take 10 mg by mouth at bedtime.    . Multiple Vitamins-Minerals (MULTIVITAMINS THER. W/MINERALS) TABS Take 1 tablet by mouth daily.    . nortriptyline (PAMELOR) 25 MG capsule Take 75 mg by mouth at bedtime.    . pilocarpine  (SALAGEN) 5 MG tablet Take 5 mg by mouth 3 (three) times daily.    . vitamin C (ASCORBIC ACID) 500 MG tablet Take 500 mg by mouth daily.    Marland Kitchen doxycycline (VIBRAMYCIN) 100 MG capsule Take 1 capsule (100 mg total) by mouth 2 (two) times daily. 28 capsule 0  . HYDROcodone-acetaminophen (NORCO/VICODIN) 5-325 MG per tablet Take 1 tablet by mouth every 6 (six) hours as needed for pain. 15 tablet 0  . levothyroxine (SYNTHROID, LEVOTHROID) 50 MCG tablet Take 50 mcg by mouth daily before breakfast.     No facility-administered medications prior to visit.     PAST MEDICAL HISTORY: Past Medical History:  Diagnosis Date  . Arthritis   . Asthma   . Neuropathy (HCC)   . Ovarian cyst   . Sjogren's disease (HCC)   . Thyroid disease    11/2016 no longer taking med    PAST SURGICAL HISTORY: Past Surgical History:  Procedure Laterality Date  . TUBAL LIGATION      FAMILY HISTORY: No family history on file.  SOCIAL HISTORY:  Social History   Social History  . Marital status: Divorced    Spouse name: N/A  . Number of children: 2  . Years of education: 14   Occupational History  .  NA, disabled since 2004   Social History Main Topics  . Smoking status: Never Smoker  . Smokeless tobacco: Never Used  . Alcohol use No  . Drug use: No  . Sexual activity: Not on file   Other Topics Concern  . Not on file   Social History Narrative   Lives with one child   Caffeine use- tea occasionally      PHYSICAL EXAM  GENERAL EXAM/CONSTITUTIONAL: Vitals:  Vitals:   11/26/16 0916  BP: 94/63  Pulse: 81  Weight: 209 lb (94.8 kg)  Height: 5\' 7"  (1.702 m)     Body mass index is 32.73 kg/m.  Visual Acuity Screening   Right eye Left eye Both eyes  Without correction:     With correction: 20/30 20/30      Patient is in no distress; well developed, nourished and groomed; neck is supple  CARDIOVASCULAR:  Examination of carotid arteries is normal; no carotid bruits  Regular  rate and rhythm, no murmurs  Examination of peripheral vascular system by observation and palpation is normal  EYES:  Ophthalmoscopic exam of optic discs and posterior segments is normal; no papilledema or hemorrhages  MUSCULOSKELETAL:  Gait, strength, tone, movements noted in Neurologic exam below  NEUROLOGIC: MENTAL STATUS:  No flowsheet data found.  awake, alert, oriented to person, place and time  recent and remote memory intact  normal attention and concentration  language fluent, comprehension intact, naming intact,   fund of knowledge appropriate  CRANIAL NERVE:   2nd - no papilledema on fundoscopic exam  2nd, 3rd, 4th, 6th - pupils equal and reactive to light, visual fields full to confrontation, extraocular muscles intact, no nystagmus  5th - facial sensation symmetric  7th - facial strength symmetric  8th - hearing intact  9th - palate elevates symmetrically, uvula midline  11th - shoulder shrug symmetric  12th - tongue protrusion midline  MOTOR:   normal bulk and tone, full strength in the BUE, BLE  INTERMITTENT LEFT SHOULDER / ARM MUSCLE JERKS  SENSORY:   normal and symmetric to light touch, temperature, vibration  DECR PP IN FEET AND ANKLES  COORDINATION:   finger-nose-finger, fine finger movements normal  REFLEXES:   deep tendon reflexes TRACE and symmetric; EXCEPT ABSENT AT ANKLES  GAIT/STATION:   narrow based gait; ANTALGIC SLOW GAIT; LIMPS ON LEFT KNEE    DIAGNOSTIC DATA (LABS, IMAGING, TESTING) - I reviewed patient records, labs, notes, testing and imaging myself where available.  Lab Results  Component Value Date   WBC 5.8 02/07/2013   HGB 11.7 (L) 02/07/2013   HCT 34.3 (L) 02/07/2013   MCV 87.5 02/07/2013   PLT 205 02/07/2013      Component Value Date/Time   NA 141 02/07/2013 1128   K 4.2 02/07/2013 1128   CL 104 02/07/2013 1128   CO2 32 02/07/2013 1128   GLUCOSE 99 02/07/2013 1128   BUN 7 02/07/2013 1128    CREATININE 0.78 02/07/2013 1128   CALCIUM 9.5 02/07/2013 1128   GFRNONAA >90 02/07/2013 1128   GFRAA >90 02/07/2013 1128   No results found for: CHOL, HDL, LDLCALC, LDLDIRECT, TRIG, CHOLHDL No results found for: YQMV7Q No results found for: VITAMINB12 No results found for: TSH   10/22/11 VEP: Impression: Visual evoked response test study on the right was normal. Study on the left showed no response. This study would be consistent with a significant lesion in the anterior visual pathway on the left, consistent with a severe optic neuritis  or optic neuropathy. Poor patient cooperation may also produce no response. Clinical correlation is required.  02/23/16 MRI brain [[facility]; per neurology note] - Mild T2 flair hyperintensities including pericallosal and infratentorial lesions, unchanged from July 2016. No abnormal enhancing lesions.    ASSESSMENT AND PLAN  46 y.o. year old female here with long history of autoimmune disease including juvenile rheumatoid arthritis, rheumatic fever, Sjogren's syndrome, with neurologic manifestations of Sjgren syndrome including left optic neuritis, white matter brain lesions, myoclonus, numbness and tingling. Overall her neurologic symptoms and MRI scans have been stable.   Dx: neurologic sjogren's disease (with CNS and PNS involvement)  1. Sjogren's syndrome with other organ involvement (HCC)   2. 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 Muscle spasm      PLAN: - continue MTX, plaquenil, sulfasalazine for sjogren's syndrome (per rheumatology Dr. Marshall Cork at [facility]) - continue clonazepam and baclofen for muscle spasms - will setup PT evaluation - fall risk --> use cane or walker as needed - monitor neurologic symptoms  Orders Placed This Encounter  Procedures  . Ambulatory referral to Physical Therapy   Return in about 3 months (around 02/23/2017).  I reviewed images, labs, notes, records myself. I summarized findings and reviewed with patient,  for this high risk condition (neurologic sjogren's syndrome) requiring high complexity decision making.    Suanne Marker, MD 11/26/2016, 10:02 AM Certified in Neurology, Neurophysiology and Neuroimaging  [facility] [address], [address] [place], [place] [address] [phone]

## 2016-12-01 ENCOUNTER — Ambulatory Visit: Payer: Medicare Other | Admitting: Diagnostic Neuroimaging

## 2016-12-03 ENCOUNTER — Ambulatory Visit: Payer: Medicare Other | Attending: Diagnostic Neuroimaging | Admitting: Physical Therapy

## 2016-12-03 DIAGNOSIS — R2689 Other abnormalities of gait and mobility: Secondary | ICD-10-CM | POA: Diagnosis present

## 2016-12-03 DIAGNOSIS — M25551 Pain in right hip: Secondary | ICD-10-CM | POA: Insufficient documentation

## 2016-12-03 DIAGNOSIS — M6281 Muscle weakness (generalized): Secondary | ICD-10-CM

## 2016-12-03 NOTE — Patient Instructions (Signed)
Outer Hip Stretch: Reclined IT Band Stretch (Strap)    Strap around right foot, pull across only as far as possible with shoulders on mat. Hold for __30__ seconds. Repeat __2__ times each leg.  Perform 2 sessions per day.  Copyright  VHI. All rights reserved.     Quads / HF, Supine    Lie near edge of bed, one leg bent, foot flat on bed. Other leg hanging over edge, relaxed, thigh resting entirely on bed. Bend hanging knee backward keeping thigh in contact with bed. Hold _30__ seconds.  Use leash around foot to allow for leg to be relaxed. Repeat __2_ times per session. Do _2__ sessions per day.  Copyright  VHI. All rights reserved.

## 2016-12-03 NOTE — Therapy (Signed)
[facility] [address] [place], [place], [address] Phone: [phone]   Fax:  [phone]  Physical Therapy Evaluation  Patient Details  Name: Sara Hayes MRN: [id number] Date of Birth: 1970/11/18 Referring Provider: Joycelyn Schmid, MD  Encounter Date: 12/03/2016      PT End of Session - 12/03/16 1534    Visit Number 1   Number of Visits 12   Date for PT Re-Evaluation 01/14/17   Authorization Type UHC Medicare   PT Start Time 1400   PT Stop Time 1440   PT Time Calculation (min) 40 min   Activity Tolerance Patient tolerated treatment well   Behavior During Therapy [facility] for tasks assessed/performed      Past Medical History:  Diagnosis Date  . Arthritis   . Asthma   . Neuropathy (HCC)   . Ovarian cyst   . Sjogren's disease (HCC)   . Thyroid disease    11/2016 no longer taking med    Past Surgical History:  Procedure Laterality Date  . TUBAL LIGATION      There were no vitals filed for this visit.       Subjective Assessment - 12/03/16 1405    Subjective Pt is a 46 y/o female who presents to OPPT with R hip pain and falls since Nov 2017.  Pt seeing doctors in [place], [place], but recently moved to [facility].  Pt c/o continued pain in R hip and occasional falls due to pain.   Pertinent History Sjogren's Syndrome, arthritis, asthma, neuropathy   Limitations Walking;Standing   Diagnostic tests xrays: negative;    Patient Stated Goals improve pain   Currently in Pain? Yes   Pain Score 2   up to 8-9/10   Pain Location Hip   Pain Orientation Right   Pain Descriptors / Indicators Constant;Nagging;Sharp;Stabbing   Pain Type Chronic pain   Pain Radiating Towards ant upper thigh   Pain Onset More than a month ago   Pain Frequency Other (Comment)  with sharp pains   Aggravating Factors  stepping, walking   Pain Relieving Factors sitting, rest            [facility] PT Assessment - 12/03/16 1412      Assessment   Medical Diagnosis Sjorgen's Syndrome, R hip pain   Referring Provider Joycelyn Schmid, MD   Onset Date/Surgical Date --  Nov 2017   Next MD Visit 03/10/17   Prior Therapy none for this condition     Precautions   Precautions Fall     Restrictions   Weight Bearing Restrictions No     Balance Screen   Has the patient fallen in the past 6 months Yes   How many times? 2   Has the patient had a decrease in activity level because of a fear of falling?  No   Is the patient reluctant to leave their home because of a fear of falling?  No     Home Environment   Living Environment Private residence   Living Arrangements Children  28 y/o son   Type of Home House   Home Access Stairs to enter   Entrance Stairs-Number of Steps 4   Entrance Stairs-Rails None   Home Layout One level     Prior Function   Level of Independence Independent   Vocation On disability   Leisure walking     Cognition   Overall Cognitive Status Within Functional Limits for tasks assessed     Observation/Other Assessments  Focus on Therapeutic Outcomes (FOTO)  33 (67% limited; predicted 46% limited)     Posture/Postural Control   Posture/Postural Control Postural limitations   Postural Limitations Rounded Shoulders;Forward head     AROM   Overall AROM Comments hip flexion limited to ~ 90 deg due to pain     Strength   Strength Assessment Site Hip;Knee;Ankle   Right/Left Hip Right;Left   Right Hip Flexion 3+/5   Right Hip Extension 3/5   Right Hip ABduction 3-/5   Right Hip ADduction 3/5   Left Hip Flexion 3+/5   Left Hip Extension 3-/5   Left Hip ABduction 3/5   Left Hip ADduction 3/5   Right/Left Knee Right;Left   Right Knee Flexion 3/5   Right Knee Extension 4/5   Left Knee Flexion 3/5   Left Knee Extension 4/5   Right/Left Ankle Right;Left   Right Ankle Dorsiflexion 5/5   Left Ankle Dorsiflexion 4/5     Flexibility   Soft Tissue Assessment /Muscle Length yes   Hamstrings  tightness bil   Quadriceps tightness on R   ITB tightness on R     Palpation   Palpation comment tender to palpation along R greater trochanter     Ambulation/Gait   Ambulation/Gait Yes   Ambulation/Gait Assistance 6: Modified independent (Device/Increase time)   Ambulation Distance (Feet) 100 Feet   Assistive device None   Gait Pattern Antalgic;Decreased hip/knee flexion - right;Decreased hip/knee flexion - left                   [facility] Adult PT Treatment/Exercise - 12/03/16 1412      Exercises   Exercises Knee/Hip     Knee/Hip Exercises: Stretches   Quad Stretch Right;2 reps;30 seconds   Quad Stretch Limitations for HEP instruction; supine with strap   ITB Stretch Right;2 reps;30 seconds   ITB Stretch Limitations for HEP instruction; supine with strap                PT Education - 12/03/16 1534    Education provided Yes   Education Details HEP   Person(s) Educated Patient   Methods Explanation;Demonstration;Handout   Comprehension Verbalized understanding;Returned demonstration;Need further instruction             PT Long Term Goals - 12/03/16 1538      PT LONG TERM GOAL #1   Title independent with HEP (01/14/17)   Time 6   Period Weeks   Status New     PT LONG TERM GOAL #2   Title report pain < 5/10 with ambulation for improved mobility and decreased pain (01/14/17)   Time 6   Period Weeks   Status New     PT LONG TERM GOAL #3   Title demonstrate 4/5 strength in RLE for improved function and mobility (01/14/17)   Time 6   Period Weeks   Status New     PT LONG TERM GOAL #4   Title perform dynamic gait assessment with LTG to be written (01/14/17)   Time 6   Period Weeks   Status New               Plan - 12/03/16 1535    Clinical Impression Statement Pt is a 46 y/o female who presents to OPPT for moderate complexity PT eval for R hip pain affecting mobility and ADLs.  Pt with some tenderness and decreased motion of hip, as  well as decreased strength and flexibility affecting mobility.  Will  benefit from PT to address deficits.     Rehab Potential Good   PT Frequency 2x / week   PT Duration 6 weeks   PT Treatment/Interventions ADLs/Self Care Home Management;Cryotherapy;Electrical Stimulation;Iontophoresis 4mg /ml Dexamethasone;Moist Heat;Ultrasound;Neuromuscular re-education;Balance training;Therapeutic exercise;Therapeutic activities;Functional mobility training;Stair training;Gait training;Patient/family education;Manual techniques;Taping;Dry needling   PT Next Visit Plan review HEP and add strengthening exercises, needs DGI, modalities PRN (?ionto if order signed, otherwise estim/heat)   Consulted and Agree with Plan of Care Patient      Patient will benefit from skilled therapeutic intervention in order to improve the following deficits and impairments:  Abnormal gait, Decreased activity tolerance, Decreased balance, Decreased mobility, Difficulty walking, Decreased strength, Decreased range of motion, Impaired flexibility, Postural dysfunction, Pain  Visit Diagnosis: Pain in right hip - Plan: PT plan of care cert/re-cert  Muscle weakness (generalized) - Plan: PT plan of care cert/re-cert  Other abnormalities of gait and mobility - Plan: PT plan of care cert/re-cert      G-Codes - 12-05-2016 1545    Functional Assessment Tool Used (Outpatient Only) FOTO   Functional Limitation Mobility: Walking and moving around   Mobility: Walking and Moving Around Goal Status [phone]) At least 60 percent but less than 80 percent impaired, limited or restricted   Mobility: Walking and Moving Around Discharge Status [id number]) At least 40 percent but less than 60 percent impaired, limited or restricted       Problem List Patient Active Problem List   Diagnosis Date Noted  . Muscle spasm 11/26/2016      Clarita Crane, PT, DPT 12/05/2016 3:46 PM    [facility] [address] [place], [place], [address] Phone: [phone]   Fax:  [phone]  Name: Sara Hayes MRN: [id number] Date of Birth: 1971-10-03

## 2016-12-08 ENCOUNTER — Ambulatory Visit: Payer: Medicare Other | Attending: Diagnostic Neuroimaging | Admitting: Physical Therapy

## 2016-12-08 ENCOUNTER — Encounter: Payer: Self-pay | Admitting: Physical Therapy

## 2016-12-08 DIAGNOSIS — M25551 Pain in right hip: Secondary | ICD-10-CM | POA: Insufficient documentation

## 2016-12-08 DIAGNOSIS — M6281 Muscle weakness (generalized): Secondary | ICD-10-CM | POA: Insufficient documentation

## 2016-12-08 DIAGNOSIS — R2689 Other abnormalities of gait and mobility: Secondary | ICD-10-CM | POA: Diagnosis present

## 2016-12-08 NOTE — Patient Instructions (Addendum)
Bracing With Bridging (Hook-Lying)    With neutral spine, tighten pelvic floor and abdominals and hold. Lift bottom. Repeat _5__ times 2. Do _1-2__ times a day.   Functional Quadriceps: Sit to Stand    Sit on edge of chair, feet flat on floor. Stand upright, extending knees fully. Repeat __5__ times per set. Do 2____ sets per session. Do _1-2___ sessions per day.  http://orth.exer.us/735    Copyright  VHI. All rights reserved.  Outer Hip Stretch: Reclined IT Band Stretch (Strap)    Strap around right foot, pull across only as far as possible with shoulders on mat. Hold for __30__ seconds. Repeat __2__ times each leg.  Perform 2 sessions per day.  Copyright  VHI. All rights reserved.     Quads / HF, Supine    Lie near edge of bed, one leg bent, foot flat on bed. Other leg hanging over edge, relaxed, thigh resting entirely on bed. Bend hanging knee backward keeping thigh in contact with bed. Hold _30__ seconds.  Use leash around foot to allow for leg to be relaxed. Repeat __2_ times per session. Do _2__ sessions per day.  Copyright  VHI. All rights reserved.   Copyright  VHI. All rights reserved.

## 2016-12-08 NOTE — Therapy (Signed)
[facility] [address] [place], [place], [address] Phone: [phone]   Fax:  [phone]  Physical Therapy Treatment  Patient Details  Name: Sara Hayes MRN: [id number] Date of Birth: November 05, 1970 Referring Provider: Andrey Spearman, MD  Encounter Date: 12/08/2016      PT End of Session - 12/08/16 1549    Visit Number 2   Number of Visits 12   Date for PT Re-Evaluation 01/14/17   Authorization Type UHC Medicare   PT Start Time 1452   PT Stop Time 1533   PT Time Calculation (min) 41 min   Activity Tolerance Patient tolerated treatment well   Behavior During Therapy [facility] for tasks assessed/performed      Past Medical History:  Diagnosis Date  . Arthritis   . Asthma   . Neuropathy ([place])   . Ovarian cyst   . Sjogren's disease ([place])   . Thyroid disease    11/2016 no longer taking med    Past Surgical History:  Procedure Laterality Date  . TUBAL LIGATION      There were no vitals filed for this visit.      Subjective Assessment - 12/08/16 1455    Subjective Feels like to pain is more constant since starting the stretching. Reports sharp pain if rolling onto it to get up from bed or when initially stepping forward.   Pertinent History Sjogren's Syndrome, arthritis, asthma, neuropathy   Limitations Walking;Standing   Diagnostic tests xrays: negative;    Patient Stated Goals improve pain   Currently in Pain? Yes   Pain Score 5    Pain Location Hip   Pain Orientation Right   Pain Descriptors / Indicators Sharp;Stabbing;Nagging;Constant   Pain Type Chronic pain   Pain Onset More than a month ago   Pain Frequency Intermittent  sharp pain, constant nagging pain                         [facility] Adult PT Treatment/Exercise - 12/08/16 0001      Exercises   Exercises Knee/Hip  Performed hip stretches from last session( with min cues for technique) and added hip strengthening exercises  with cues for body mechanics; see pt instruction.     Knee/Hip Exercises: Aerobic   Other Aerobic Scifit seated stepper Level 2.0, 8 min, for warmup and hip ROM, no increase in pain or sharp pain.                PT Education - 12/08/16 1517    Education provided Yes   Education Details Reviewed HEP stretches and added hip strengthening exercises.   Person(s) Educated Patient   Methods Explanation;Verbal cues   Comprehension Verbalized understanding;Returned demonstration;Verbal cues required;Need further instruction             PT Long Term Goals - 12/03/16 1538      PT LONG TERM GOAL #1   Title independent with HEP (01/14/17)   Time 6   Period Weeks   Status New     PT LONG TERM GOAL #2   Title report pain < 5/10 with ambulation for improved mobility and decreased pain (01/14/17)   Time 6   Period Weeks   Status New     PT LONG TERM GOAL #3   Title demonstrate 4/5 strength in RLE for improved function and mobility (01/14/17)   Time 6   Period Weeks   Status New     PT LONG  TERM GOAL #4   Title perform dynamic gait assessment with LTG to be written (01/14/17)   Time 6   Period Weeks   Status New               Plan - 12/08/16 1550    Clinical Impression Statement Pt continues to have significant R hip pain and reports that it has become more constant/aggravated since given hip stretches lasts week.  Pt tolerated seated Sci fit stepper for warm up, bridging and sit<>stand strengthening exercise without increase pain, sharp pain or "popping" sensation. Pt did report some "popping" with seated hip and knee flexion/extension with green theraband which was not added to HEP to avoid aggravating R hip.                                                Rehab Potential Good   PT Frequency 2x / week   PT Duration 6 weeks   PT Treatment/Interventions ADLs/Self Care Home Management;Cryotherapy;Electrical Stimulation;Iontophoresis 4mg /ml Dexamethasone;Moist  Heat;Ultrasound;Neuromuscular re-education;Balance training;Therapeutic exercise;Therapeutic activities;Functional mobility training;Stair training;Gait training;Patient/family education;Manual techniques;Taping;Dry needling   PT Next Visit Plan review HEP strengthening exercises, needs DGI, modalities PRN (?ionto if order signed, otherwise estim/heat)   Consulted and Agree with Plan of Care Patient      Patient will benefit from skilled therapeutic intervention in order to improve the following deficits and impairments:  Abnormal gait, Decreased activity tolerance, Decreased balance, Decreased mobility, Difficulty walking, Decreased strength, Decreased range of motion, Impaired flexibility, Postural dysfunction, Pain  Visit Diagnosis: Pain in right hip  Muscle weakness (generalized)  Other abnormalities of gait and mobility     Problem List Patient Active Problem List   Diagnosis Date Noted  . Muscle spasm 11/26/2016    Bjorn Loser, PTA  12/08/16, 4:01 PM [facility] [address] [place], [place], [address] Phone: [phone]   Fax:  [phone]  Name: Sara Hayes MRN: [id number] Date of Birth: 03-04-1971

## 2016-12-11 ENCOUNTER — Ambulatory Visit: Payer: Medicare Other | Admitting: Physical Therapy

## 2016-12-17 ENCOUNTER — Encounter: Payer: Self-pay | Admitting: Physical Therapy

## 2016-12-17 ENCOUNTER — Ambulatory Visit: Payer: Medicare Other | Admitting: Physical Therapy

## 2016-12-17 DIAGNOSIS — R2689 Other abnormalities of gait and mobility: Secondary | ICD-10-CM

## 2016-12-17 DIAGNOSIS — M25551 Pain in right hip: Secondary | ICD-10-CM | POA: Diagnosis not present

## 2016-12-17 DIAGNOSIS — M6281 Muscle weakness (generalized): Secondary | ICD-10-CM

## 2016-12-17 NOTE — Therapy (Signed)
TERM GOAL #2   Title report pain < 5/10 with ambulation for improved mobility and decreased pain (01/14/17)   Time 6   Period Weeks   Status New     PT LONG TERM GOAL #3   Title demonstrate 4/5 strength in RLE for improved function and mobility (01/14/17)   Time 6   Period Weeks   Status New     PT LONG TERM GOAL #4   Title perform dynamic gait assessment with LTG to be written (01/14/17)   Time 6   Period Weeks   Status New               Plan - 12/17/16 1623    Clinical Impression Statement Pt's R hip pain stayed around 5/10 during session with exercise/ movement.  Noted pt having poor "push"/toe off dudring gait bilaterally and with training pt was able demonstrate greater  toe off during terminal stance phase.   Rehab Potential Good   PT Frequency 2x / week   PT Duration 6  weeks   PT Treatment/Interventions ADLs/Self Care Home Management;Cryotherapy;Electrical Stimulation;Iontophoresis 4mg /ml Dexamethasone;Moist Heat;Ultrasound;Neuromuscular re-education;Balance training;Therapeutic exercise;Therapeutic activities;Functional mobility training;Stair training;Gait training;Patient/family education;Manual techniques;Taping;Dry needling   PT Next Visit Plan needs DG; review HEP strengthening exercises, I, modalities PRN (?ionto if order signed, otherwise estim/heat)   Consulted and Agree with Plan of Care Patient      Patient will benefit from skilled therapeutic intervention in order to improve the following deficits and impairments:  Abnormal gait, Decreased activity tolerance, Decreased balance, Decreased mobility, Difficulty walking, Decreased strength, Decreased range of motion, Impaired flexibility, Postural dysfunction, Pain  Visit Diagnosis: Pain in right hip  Muscle weakness (generalized)  Other abnormalities of gait and mobility     Problem List Patient Active Problem List   Diagnosis Date Noted  . Muscle spasm 11/26/2016    Bjorn Loser, PTA  12/17/16, 4:28 PM [facility] [address] [place], [place], [address] Phone: [phone]   Fax:  [phone]  Name: Sara Hayes MRN: [id number] Date of Birth: 1971-02-05  [facility] [address] [place], [place], [address] Phone: [phone]   Fax:  [phone]  Physical Therapy Treatment  Patient Details  Name: Sara Hayes MRN: [id number] Date of Birth: 01-22-1971 Referring Provider: Andrey Spearman, MD  Encounter Date: 12/17/2016      PT End of Session - 12/17/16 1405    Visit Number 3   Number of Visits 12   Date for PT Re-Evaluation 01/14/17   Authorization Type UHC Medicare   PT Start Time 6314   PT Stop Time 1403   PT Time Calculation (min) 40 min   Activity Tolerance Patient tolerated treatment well   Behavior During Therapy [facility] for tasks assessed/performed      Past Medical History:  Diagnosis Date  . Arthritis   . Asthma   . Neuropathy ([place])   . Ovarian cyst   . Sjogren's disease ([place])   . Thyroid disease    11/2016 no longer taking med    Past Surgical History:  Procedure Laterality Date  . TUBAL LIGATION      There were no vitals filed for this visit.      Subjective Assessment - 12/17/16 1326    Subjective Pt has to use UE to aid lifting Right LE out of the car due to movement being painful.  Still has occasional sharp pain when walking esp. during  first couple of steps.   Pertinent History Sjogren's Syndrome, arthritis, asthma, neuropathy   Limitations Walking;Standing   Diagnostic tests xrays: negative;    Patient Stated Goals improve pain   Currently in Pain? Yes   Pain Score 5    Pain Location Hip   Pain Orientation Right   Pain Descriptors / Indicators Stabbing;Nagging;Constant   Pain Type Chronic pain                         [facility] Adult PT Treatment/Exercise - 12/17/16 0001      Ambulation/Gait   Ambulation/Gait Yes   Ambulation/Gait Assistance 6: Modified independent (Device/Increase time)  working on greater "push off" from toe during heel-toe gait.   Ambulation Distance (Feet) 600 Feet   Assistive device None   Gait Pattern Antalgic;Decreased hip/knee flexion - right;Decreased hip/knee flexion - left     Knee/Hip Exercises: Supine   Hip Adduction Isometric AAROM;Strengthening;Right;5 reps  with slide board under heel   Other Supine Knee/Hip Exercises whole LE isometric pressing into the mat: 5x2 each ( for decompression)   Other Supine Knee/Hip Exercises Leg lengthener:              Balance Exercises - 12/17/16 1341      Balance Exercises: Standing   Heel Raises Limitations  x10 using 2 UE support at counter   Toe Raise Limitations Toe walk with 1 UE support, using counter.           PT Education - 12/17/16 1353    Education provided Yes   Education Details Updated HEP to include gastroc strengthening and toe walk for  balance.   Person(s) Educated Patient   Methods Explanation;Demonstration;Verbal cues;Handout   Comprehension Verbalized understanding;Returned demonstration;Verbal cues required;Need further instruction             PT Long Term Goals - 12/03/16 1538      PT LONG TERM GOAL #1   Title independent with HEP (01/14/17)   Time 6   Period Weeks   Status New     PT LONG

## 2016-12-17 NOTE — Patient Instructions (Addendum)
Heel Raises    Stand with support. Tighten pelvic floor and hold. With knees straight, raise heels off ground. Hold __3_ seconds. Relax for _3__ seconds. Repeat _10__ times. Do _1__ times a day.  Copyright  VHI. All rights reserved.     Heel Walk    Raising toes and front of feet off pool bottom, walk on heels. Session: Walk along hall or counter__ Do __1-2_ sessions per day  Move: Forward   Copyright  VHI. All rights reserved.

## 2016-12-18 ENCOUNTER — Ambulatory Visit: Payer: Medicare Other | Admitting: Physical Therapy

## 2016-12-18 ENCOUNTER — Encounter: Payer: Self-pay | Admitting: Physical Therapy

## 2016-12-18 DIAGNOSIS — R2689 Other abnormalities of gait and mobility: Secondary | ICD-10-CM

## 2016-12-18 DIAGNOSIS — M6281 Muscle weakness (generalized): Secondary | ICD-10-CM

## 2016-12-18 DIAGNOSIS — M25551 Pain in right hip: Secondary | ICD-10-CM

## 2016-12-18 NOTE — Therapy (Signed)
[place] #1   Title independent with HEP (01/14/17)   Time 6   Period Weeks   Status New     PT LONG TERM GOAL #2   Title report pain < 5/10 with ambulation for improved mobility and decreased pain (01/14/17)   Time 6   Period Weeks   Status New     PT LONG TERM GOAL #3   Title demonstrate 4/5 strength in RLE for improved function and mobility (01/14/17)   Time 6   Period Weeks   Status New     PT LONG TERM GOAL #4   Title perform dynamic gait assessment with LTG to be written (01/14/17)   Time 6   Period Weeks   Status New               Plan - 12/18/16 1515    Clinical Impression Statement Pt's R hip pain stayed around 5/10 during session with exercise and gait.  Pt was able to carry over greater "push"/toe off during gait on treadmill. Scored 19/24 on DGI indicating fall risk.   Rehab Potential Good   PT  Frequency 2x / week   PT Duration 6 weeks   PT Treatment/Interventions ADLs/Self Care Home Management;Cryotherapy;Electrical Stimulation;Iontophoresis 4mg /ml Dexamethasone;Moist Heat;Ultrasound;Neuromuscular re-education;Balance training;Therapeutic exercise;Therapeutic activities;Functional mobility training;Stair training;Gait training;Patient/family education;Manual techniques;Taping;Dry needling   PT Next Visit Plan needs DGI LTG; review HEP strengthening exercises,SLS training,  modalities PRN (?ionto if order signed, otherwise estim/heat)   Consulted and Agree with Plan of Care Patient      Patient will benefit from skilled therapeutic intervention in order to improve the following deficits and impairments:  Abnormal gait, Decreased activity tolerance, Decreased balance, Decreased mobility, Difficulty walking, Decreased strength, Decreased range of motion, Impaired flexibility, Postural dysfunction, Pain  Visit Diagnosis: Pain in right hip  Muscle weakness (generalized)  Other abnormalities of gait and mobility     Problem List Patient Active Problem List   Diagnosis Date Noted  . Muscle spasm 11/26/2016    Bjorn Loser, PTA  12/18/16, 3:46 PM [facility] [address] [place], [place], [address] Phone: [phone]   Fax:  [phone]  Name: Sara Hayes MRN: [id number] Date of Birth: 1971-03-13  [facility] [address] [place], [place], [address] Phone: [phone]   Fax:  [phone]  Physical Therapy Treatment  Patient Details  Name: Sara Hayes MRN: [id number] Date of Birth: 04-24-1971 Referring Provider: Andrey Spearman, MD  Encounter Date: 12/18/2016      PT End of Session - 12/18/16 1541    Visit Number 4   Number of Visits 12   Date for PT Re-Evaluation 01/14/17   Authorization Type UHC Medicare   PT Start Time 1452   PT Stop Time 1530   PT Time Calculation (min) 38 min   Activity Tolerance Patient tolerated treatment well   Behavior During Therapy [facility] for tasks assessed/performed      Past Medical History:  Diagnosis Date  . Arthritis   . Asthma   . Neuropathy ([place])   . Ovarian cyst   . Sjogren's disease ([place])   . Thyroid disease    11/2016 no longer taking med    Past Surgical History:  Procedure Laterality Date  . TUBAL LIGATION      There were no vitals filed for this visit.      Subjective Assessment - 12/18/16 1453    Subjective "I was ok...," no increased pain from yesterday's session.   Pertinent History Sjogren's Syndrome, arthritis, asthma, neuropathy   Limitations Walking;Standing   Diagnostic tests xrays: negative;    Patient Stated Goals improve pain   Currently in Pain? Yes   Pain Score 5    Pain Location Hip   Pain Orientation Right   Pain Descriptors / Indicators Stabbing;Nagging;Constant   Pain Type Chronic pain   Pain Onset More than a month ago   Pain Frequency Intermittent                         [facility] Adult PT Treatment/Exercise - 12/18/16 0001      Ambulation/Gait   Ambulation/Gait Yes   Ambulation/Gait Assistance 5: Supervision   Ambulation/Gait Assistance Details Treadmill training for warmup, speed up to 1.68mph, 39min, no increase in hip pain    Ambulation Distance (Feet) 115 Feet   Assistive device None   Gait Pattern  Decreased hip/knee flexion - right;Decreased hip/knee flexion - left     Dynamic Gait Index   Level Surface Mild Impairment   Change in Gait Speed Mild Impairment   Gait with Horizontal Head Turns Normal   Gait with Vertical Head Turns Normal   Gait and Pivot Turn Mild Impairment   Step Over Obstacle Mild Impairment   Step Around Obstacles Normal   Steps Mild Impairment   Total Score 19     Knee/Hip Exercises: Supine   Other Supine Knee/Hip Exercises whole LE isometric pressing into the mat: 5x2 each ( for decompression)             Balance Exercises - 12/18/16 1505      Balance Exercises: Standing   Heel Raises Limitations  x10 using 2 UE support at counter   Toe Raise Limitations Toe walk with 1 UE support, using counter; reports sharp pain when turning x1.           PT Education - 12/18/16 1509    Education provided Yes   Education Details Updated HEP to initiate walking program.   Person(s) Educated Patient   Methods Explanation   Comprehension Verbalized understanding             PT Long Term Goals - 12/03/16

## 2016-12-18 NOTE — Patient Instructions (Signed)
Walking Program:  Begin walking for exercise for 8 minutes,  most days/week.   Progress your walking program by adding 1-2 minutes to your routine each week, as tolerated. Be sure to wear good walking shoes, walk in a safe environment and only progress to your tolerance.

## 2016-12-19 ENCOUNTER — Ambulatory Visit: Payer: Medicare Other | Admitting: Physical Therapy

## 2016-12-24 ENCOUNTER — Ambulatory Visit: Payer: Medicare Other | Admitting: Physical Therapy

## 2016-12-24 DIAGNOSIS — R2689 Other abnormalities of gait and mobility: Secondary | ICD-10-CM

## 2016-12-24 DIAGNOSIS — M6281 Muscle weakness (generalized): Secondary | ICD-10-CM

## 2016-12-24 DIAGNOSIS — M25551 Pain in right hip: Secondary | ICD-10-CM

## 2016-12-24 NOTE — Therapy (Signed)
[facility] [address] [place], [place], [address] Phone: [phone]   Fax:  [phone]  Physical Therapy Treatment  Patient Details  Name: Azariyah Kowalkowski MRN: [id number] Date of Birth: [birth year] Referring Provider: Joycelyn Schmid, MD  Encounter Date: 12/24/2016      PT End of Session - 12/24/16 1411    Visit Number 5   Number of Visits 12   Date for PT Re-Evaluation 01/14/17   Authorization Type UHC Medicare   PT Start Time 1316   PT Stop Time 1415   PT Time Calculation (min) 59 min   Activity Tolerance Patient tolerated treatment well;No increased pain   Behavior During Therapy [facility] for tasks assessed/performed      Past Medical History:  Diagnosis Date  . Arthritis   . Asthma   . Neuropathy ([facility])   . Ovarian cyst   . Sjogren's disease ([facility])   . Thyroid disease    11/2016 no longer taking med    Past Surgical History:  Procedure Laterality Date  . TUBAL LIGATION      There were no vitals filed for this visit.      Subjective Assessment - 12/24/16 1318    Subjective no increased pain after last session.  feels her hip "is giving me a fit" today, "it must be the weather."  pain seems to fluctuate and not sure what causes it.   Pertinent History Sjogren's Syndrome, arthritis, asthma, neuropathy   Limitations Walking;Standing   Diagnostic tests xrays: negative;    Patient Stated Goals improve pain   Currently in Pain? Yes   Pain Score 6    Pain Location Hip   Pain Orientation Right   Pain Descriptors / Indicators Shooting;Sharp   Pain Type Chronic pain   Pain Radiating Towards ant upper thigh   Pain Onset More than a month ago   Pain Frequency Intermittent   Aggravating Factors  stepping, walking   Pain Relieving Factors sitting, rest                         [facility] Adult PT Treatment/Exercise - 12/24/16 1323      Knee/Hip Exercises: Aerobic   Other Aerobic Scifit  seated stepper Level 2.0, 8 min, for warmup and hip ROM, no increase in pain or shrp pain.     Knee/Hip Exercises: Supine   Other Supine Knee/Hip Exercises whole LE isometric pressing into the mat: 5x2 each ( for decompression)   Other Supine Knee/Hip Exercises single limb clamshells with red theraband x10 bil     Modalities   Modalities Moist Heat;Electrical Stimulation     Moist Heat Therapy   Number Minutes Moist Heat 15 Minutes   Moist Heat Location Hip     Electrical Stimulation   Electrical Stimulation Location Rt hip   Electrical Stimulation Action IFC   Electrical Stimulation Parameters to tolerance   Electrical Stimulation Goals Pain     Manual Therapy   Manual Therapy Soft tissue mobilization;Myofascial release;Joint mobilization   Joint Mobilization supine Rt hip long axis distraction 10x10 sec holds   Soft tissue mobilization self mobilization with tennis ball standing against wall   Myofascial Release self release with tennis ball Rt hip                PT Education - 12/24/16 1410    Education provided Yes   Education Details use of tennis ball for self mobilization   Person(s) Educated  Patient   Methods Explanation   Comprehension Verbalized understanding             PT Long Term Goals - 12/24/16 1419      PT LONG TERM GOAL #1   Title independent with HEP (01/14/17)   Status On-going     PT LONG TERM GOAL #2   Title report pain < 5/10 with ambulation for improved mobility and decreased pain (01/14/17)   Status On-going     PT LONG TERM GOAL #3   Title demonstrate 4/5 strength in RLE for improved function and mobility (01/14/17)   Status On-going     PT LONG TERM GOAL #4   Title perform dynamic gait assessment with LTG to be written (01/14/17)   Status Achieved     PT LONG TERM GOAL #5   Title improve dynamic gait index to >/= 22/24 for improved mobility and decreased fall risk (01/14/17)   Status New               Plan -  12/24/16 1412    Clinical Impression Statement Pt tolerated session well today without c/o increased pain.  Increased time needed with all exercises as pt takes time in between each rep.  Issued tennis ball and instructed in self myofacial release with ball.  Tried estim today to see if pt has more relief, which pain decreased after session today.  Will continue to benefit from PT to maximize functional mobility.   PT Treatment/Interventions ADLs/Self Care Home Management;Cryotherapy;Electrical Stimulation;Iontophoresis 4mg /ml Dexamethasone;Moist Heat;Ultrasound;Neuromuscular re-education;Balance training;Therapeutic exercise;Therapeutic activities;Functional mobility training;Stair training;Gait training;Patient/family education;Manual techniques;Taping;Dry needling   PT Next Visit Plan review HEP strengthening exercises, I, modalities PRN, try ionto   Consulted and Agree with Plan of Care Patient      Patient will benefit from skilled therapeutic intervention in order to improve the following deficits and impairments:  Abnormal gait, Decreased activity tolerance, Decreased balance, Decreased mobility, Difficulty walking, Decreased strength, Decreased range of motion, Impaired flexibility, Postural dysfunction, Pain  Visit Diagnosis: Pain in right hip  Muscle weakness (generalized)  Other abnormalities of gait and mobility     Problem List Patient Active Problem List   Diagnosis Date Noted  . Muscle spasm 11/26/2016      Clarita Crane, PT, DPT 12/24/16 2:39 PM      [facility] [address] [place], [place], [address] Phone: [phone]   Fax:  [phone]  Name: Cressa Dissinger MRN: [id number] Date of Birth: 03-Aug-1971

## 2016-12-26 ENCOUNTER — Ambulatory Visit: Payer: Medicare Other | Admitting: Physical Therapy

## 2016-12-26 DIAGNOSIS — M25551 Pain in right hip: Secondary | ICD-10-CM

## 2016-12-26 DIAGNOSIS — R2689 Other abnormalities of gait and mobility: Secondary | ICD-10-CM

## 2016-12-26 DIAGNOSIS — M6281 Muscle weakness (generalized): Secondary | ICD-10-CM

## 2016-12-26 NOTE — Patient Instructions (Signed)

## 2016-12-26 NOTE — Therapy (Signed)
[facility] [address] [place], [place], [address] Phone: [phone]   Fax:  [phone]  Physical Therapy Treatment  Patient Details  Name: Tanveer Bergstrand MRN: [id number] Date of Birth: 04-09-1971 Referring Provider: Joycelyn Schmid, MD  Encounter Date: 12/26/2016      PT End of Session - 12/26/16 1358    Visit Number 6   Number of Visits 12   Date for PT Re-Evaluation 01/14/17   Authorization Type [employer] Medicare   PT Start Time 1320  pt arrived late   PT Stop Time 1404   PT Time Calculation (min) 44 min   Activity Tolerance Patient tolerated treatment well   Behavior During Therapy [facility] for tasks assessed/performed      Past Medical History:  Diagnosis Date  . Arthritis   . Asthma   . Neuropathy (HCC)   . Ovarian cyst   . Sjogren's disease (HCC)   . Thyroid disease    11/2016 no longer taking med    Past Surgical History:  Procedure Laterality Date  . TUBAL LIGATION      There were no vitals filed for this visit.      Subjective Assessment - 12/26/16 1322    Subjective still having pain and difficulty with R hip, short stabbing pain in the groin that only lasts a second or two.  wants to continue to be active.   Pertinent History Sjogren's Syndrome, arthritis, asthma, neuropathy   Limitations Walking;Standing   Diagnostic tests xrays: negative;    Patient Stated Goals improve pain   Currently in Pain? Yes   Pain Score 6    Pain Location Hip   Pain Orientation Right;Anterior   Pain Descriptors / Indicators Shooting;Sharp   Pain Type Chronic pain   Pain Onset More than a month ago   Pain Frequency Intermittent   Aggravating Factors  stepping, walking   Pain Relieving Factors sitting, rest                         [facility] Adult PT Treatment/Exercise - 12/26/16 1327      Knee/Hip Exercises: Stretches   Quad Stretch Right;2 reps;30 seconds   Quad Stretch Limitations prone  with strap     Knee/Hip Exercises: Aerobic   Other Aerobic Scifit seated stepper Level 2.0, 8 min, for warmup and hip ROM, no increase in pain or shrp pain.     Knee/Hip Exercises: Sidelying   Hip ABduction Right;10 reps   Hip ABduction Limitations increased time taken between reps     Knee/Hip Exercises: Prone   Straight Leg Raises Right;10 reps     Modalities   Modalities Moist Heat;Electrical Stimulation;Iontophoresis     Moist Heat Therapy   Number Minutes Moist Heat 15 Minutes   Moist Heat Location Hip     Electrical Stimulation   Electrical Stimulation Location Rt hip   Electrical Stimulation Action IFC   Electrical Stimulation Parameters to tolerance   Electrical Stimulation Goals Pain     Iontophoresis   Type of Iontophoresis Dexamethasone   Location R greater trochanter   Dose 1.0 mL   Time 6 hour patch                PT Education - 12/26/16 1358    Education provided Yes   Education Details ionto   Person(s) Educated Patient   Methods Explanation;Handout   Comprehension Verbalized understanding  PT Long Term Goals - 12/24/16 1419      PT LONG TERM GOAL #1   Title independent with HEP (01/14/17)   Status On-going     PT LONG TERM GOAL #2   Title report pain < 5/10 with ambulation for improved mobility and decreased pain (01/14/17)   Status On-going     PT LONG TERM GOAL #3   Title demonstrate 4/5 strength in RLE for improved function and mobility (01/14/17)   Status On-going     PT LONG TERM GOAL #4   Title perform dynamic gait assessment with LTG to be written (01/14/17)   Status Achieved     PT LONG TERM GOAL #5   Title improve dynamic gait index to >/= 22/24 for improved mobility and decreased fall risk (01/14/17)   Status New               Plan - 12/26/16 1358    Clinical Impression Statement Pt continues to have minimal carryover between sessions with pain relief.  Initiated ionto today to see if this will  help.  If pain continues to be elevated, may need to hold PT and have pt return to MD.     PT Treatment/Interventions ADLs/Self Care Home Management;Cryotherapy;Electrical Stimulation;Iontophoresis 4mg /ml Dexamethasone;Moist Heat;Ultrasound;Neuromuscular re-education;Balance training;Therapeutic exercise;Therapeutic activities;Functional mobility training;Stair training;Gait training;Patient/family education;Manual techniques;Taping;Dry needling   PT Next Visit Plan assess response to ionto, continue modalities and stretching/strengthening   Consulted and Agree with Plan of Care Patient      Patient will benefit from skilled therapeutic intervention in order to improve the following deficits and impairments:  Abnormal gait, Decreased activity tolerance, Decreased balance, Decreased mobility, Difficulty walking, Decreased strength, Decreased range of motion, Impaired flexibility, Postural dysfunction, Pain  Visit Diagnosis: Pain in right hip  Muscle weakness (generalized)  Other abnormalities of gait and mobility     Problem List Patient Active Problem List   Diagnosis Date Noted  . Muscle spasm 11/26/2016      Clarita Crane, PT, DPT 12/26/16 2:07 PM    [facility] [address] [place], [place], [address] Phone: [phone]   Fax:  [phone]  Name: Belem Burrough MRN: [id number] Date of Birth: [birth year]

## 2016-12-29 ENCOUNTER — Encounter: Payer: Self-pay | Admitting: Physical Therapy

## 2016-12-29 ENCOUNTER — Ambulatory Visit: Payer: Medicare Other | Admitting: Physical Therapy

## 2016-12-29 DIAGNOSIS — M6281 Muscle weakness (generalized): Secondary | ICD-10-CM

## 2016-12-29 DIAGNOSIS — R2689 Other abnormalities of gait and mobility: Secondary | ICD-10-CM

## 2016-12-29 DIAGNOSIS — M25551 Pain in right hip: Secondary | ICD-10-CM

## 2016-12-29 NOTE — Therapy (Signed)
[facility] [address] [place], [place], [address] Phone: [phone]   Fax:  [phone]  Physical Therapy Treatment  Patient Details  Name: Sara Hayes MRN: [id number] Date of Birth: 01/31/1971 Referring Provider: Joycelyn Schmid, MD  Encounter Date: 12/29/2016      PT End of Session - 12/29/16 1406    Visit Number 7   Number of Visits 12   Date for PT Re-Evaluation 01/14/17   Authorization Type [employer] Medicare   PT Start Time 1322   PT Stop Time 1404   PT Time Calculation (min) 42 min      Past Medical History:  Diagnosis Date  . Arthritis   . Asthma   . Neuropathy (HCC)   . Ovarian cyst   . Sjogren's disease (HCC)   . Thyroid disease    11/2016 no longer taking med    Past Surgical History:  Procedure Laterality Date  . TUBAL LIGATION      There were no vitals filed for this visit.      Subjective Assessment - 12/29/16 1325    Subjective Had little to no pain after last session (with heat, e-stim, and ionto) for about 24hrs. After taking off the patch, pt contniued to have pain as usual. Still having pain and difficulty with R hip, short stabbing pain in the groin that only lasts a second or two.  wants to continue to be active.   Pertinent History Sjogren's Syndrome, arthritis, asthma, neuropathy   Limitations Walking;Standing   Diagnostic tests xrays: negative;    Patient Stated Goals improve pain   Pain Score 6    Pain Location Hip   Pain Orientation Right;Anterior   Pain Descriptors / Indicators Shooting;Sharp   Pain Type Chronic pain   Pain Onset More than a month ago   Pain Frequency Intermittent   Aggravating Factors  Movement                         [facility] Adult PT Treatment/Exercise - 12/29/16 0001      Knee/Hip Exercises: Supine   Straight Leg Raises AROM;Strengthening;Right;1 set;10 reps  Reports strain and intermittent sharp pain   Straight Leg Raises  Limitations unable to perform full ROM due to pain.     Knee/Hip Exercises: Sidelying   Hip ABduction Right;10 reps   Hip ABduction Limitations increased time taken between reps  Reports strain and intermittent sharp pain.     Modalities   Modalities Moist Heat;Electrical Stimulation     Moist Heat Therapy   Number Minutes Moist Heat 10 Minutes   Moist Heat Location Hip     Electrical Stimulation   Electrical Stimulation Location Rt hip   Electrical Stimulation Action IFC   Electrical Stimulation Parameters to tolerance 5.4 volts CV, 15 min   Electrical Stimulation Goals Pain                PT Education - 12/29/16 1612    Education provided Yes   Education Details Discussed POC, rational for estim and moist heat   Person(s) Educated Patient   Methods Explanation   Comprehension Verbalized understanding             PT Long Term Goals - 12/24/16 1419      PT LONG TERM GOAL #1   Title independent with HEP (01/14/17)   Status On-going     PT LONG TERM GOAL #2   Title report pain < 5/10  with ambulation for improved mobility and decreased pain (01/14/17)   Status On-going     PT LONG TERM GOAL #3   Title demonstrate 4/5 strength in RLE for improved function and mobility (01/14/17)   Status On-going     PT LONG TERM GOAL #4   Title perform dynamic gait assessment with LTG to be written (01/14/17)   Status Achieved     PT LONG TERM GOAL #5   Title improve dynamic gait index to >/= 22/24 for improved mobility and decreased fall risk (01/14/17)   Status New               Plan - 12/29/16 1615    Clinical Impression Statement Minimal carry over for pain relief was reported after ionto from last session. During exercises for R hip strengthening after e-stim and moist heat, pt continued to report intermittent sharp pain and strained muscle feeling.   PT Treatment/Interventions ADLs/Self Care Home Management;Cryotherapy;Electrical Stimulation;Iontophoresis  4mg /ml Dexamethasone;Moist Heat;Ultrasound;Neuromuscular re-education;Balance training;Therapeutic exercise;Therapeutic activities;Functional mobility training;Stair training;Gait training;Patient/family education;Manual techniques;Taping;Dry needling   PT Next Visit Plan assess response to ionto, continue modalities and stretching/strengthening   Consulted and Agree with Plan of Care Patient      Patient will benefit from skilled therapeutic intervention in order to improve the following deficits and impairments:  Abnormal gait, Decreased activity tolerance, Decreased balance, Decreased mobility, Difficulty walking, Decreased strength, Decreased range of motion, Impaired flexibility, Postural dysfunction, Pain  Visit Diagnosis: Pain in right hip  Muscle weakness (generalized)  Other abnormalities of gait and mobility     Problem List Patient Active Problem List   Diagnosis Date Noted  . Muscle spasm 11/26/2016   Hortencia Conradi, PTA  12/29/16, 4:20 PM  [facility] [address] [place], [place], [address] Phone: [phone]   Fax:  [phone]  Name: Rajni Streetman MRN: [id number] Date of Birth: 1971/04/21

## 2016-12-31 ENCOUNTER — Encounter: Payer: Self-pay | Admitting: Physical Therapy

## 2016-12-31 ENCOUNTER — Ambulatory Visit: Payer: Medicare Other | Admitting: Physical Therapy

## 2016-12-31 DIAGNOSIS — M25551 Pain in right hip: Secondary | ICD-10-CM

## 2016-12-31 DIAGNOSIS — M6281 Muscle weakness (generalized): Secondary | ICD-10-CM

## 2016-12-31 DIAGNOSIS — R2689 Other abnormalities of gait and mobility: Secondary | ICD-10-CM

## 2016-12-31 NOTE — Therapy (Addendum)
[facility] [address] [place], [place], [address] Phone: [phone]   Fax:  [phone]  Physical Therapy Treatment  Patient Details  Name: Sara Hayes MRN: [id number] Date of Birth: 04/20/1971 Referring Provider: Joycelyn Schmid, MD  Encounter Date: 12/31/2016      PT End of Session - 12/31/16 1614    Visit Number 8   Number of Visits 12   Date for PT Re-Evaluation 01/14/17   Authorization Type UHC Medicare   PT Start Time 1320   PT Stop Time 1400   PT Time Calculation (min) 40 min   Activity Tolerance Patient limited by pain   Behavior During Therapy [facility] for tasks assessed/performed      Past Medical History:  Diagnosis Date  . Arthritis   . Asthma   . Neuropathy (HCC)   . Ovarian cyst   . Sjogren's disease (HCC)   . Thyroid disease    11/2016 no longer taking med    Past Surgical History:  Procedure Laterality Date  . TUBAL LIGATION      There were no vitals filed for this visit.      Subjective Assessment - 12/31/16 1324    Subjective Since last visit has more sharp "popping" sensation in the front of her hip when walking or moving it; thinks that SLR aggraveted it from last session.   Pertinent History Sjogren's Syndrome, arthritis, asthma, neuropathy   Limitations Walking;Standing   Diagnostic tests xrays: negative;    Patient Stated Goals improve pain   Currently in Pain? Yes   Pain Score 7    Pain Location Hip   Pain Orientation Right;Anterior   Pain Descriptors / Indicators Shooting;Sharp   Pain Type Chronic pain   Pain Onset More than a month ago   Pain Frequency Intermittent                         [facility] Adult PT Treatment/Exercise - 12/31/16 0001      Dynamic Gait Index   Level Surface Mild Impairment   Change in Gait Speed Mild Impairment   Gait with Horizontal Head Turns Normal   Gait with Vertical Head Turns Normal   Gait and Pivot Turn Mild  Impairment   Step Over Obstacle Mild Impairment   Step Around Obstacles Normal   Steps Mild Impairment   Total Score 19     Knee/Hip Exercises: Aerobic   Other Aerobic Scifit seated stepper Level 2.0, 8 min, hip ROM, no increase in pain or shrp pain.                PT Education - 12/31/16 1340    Education provided Yes   Education Details Pt reports understanding/performing HEP at home, POC to put pt on hold per primary PT recommendation due to lack of progress with addressing R hip pain.   Person(s) Educated Patient   Methods Explanation   Comprehension Verbalized understanding             PT Long Term Goals - 12/31/16 1337      PT LONG TERM GOAL #1   Title independent with HEP (01/14/17)   Baseline Met, 12/31/16.   Status Achieved     PT LONG TERM GOAL #2   Title report pain < 5/10 with ambulation for improved mobility and decreased pain (01/14/17)   Baseline Not met; pt has been consistently reporting 6/10 with gait/mobility; 12/31/16   Status Not Met  PT LONG TERM GOAL #3   Title demonstrate 4/5 strength in RLE for improved function and mobility (01/14/17)   Baseline Not met; pt unable to perform full ROM for R SLR supine or sidelying due to pain, 12/31/16.   Status Not Met     PT LONG TERM GOAL #4   Title perform dynamic gait assessment with LTG to be written (01/14/17)   Status Achieved     PT LONG TERM GOAL #5   Title improve dynamic gait index to >/= 22/24 for improved mobility and decreased fall risk (01/14/17)   Baseline DGI 19/24; pt continues to demonstrate antalgic gait; 12/31/16.   Status New               Plan - 12/31/16 1400    Clinical Impression Statement Pt is agreeable to be put on hold per Primary PT recommnedation due having minimal to no carryover between sessions with pain relief.  Pt met HEP goal but did not meet gait (due to antalgic gait pattern), pain, or strength goals.   PT Treatment/Interventions ADLs/Self Care Home  Management;Cryotherapy;Electrical Stimulation;Iontophoresis 4mg /ml Dexamethasone;Moist Heat;Ultrasound;Neuromuscular re-education;Balance training;Therapeutic exercise;Therapeutic activities;Functional mobility training;Stair training;Gait training;Patient/family education;Manual techniques;Taping;Dry needling   PT Next Visit Plan Pt on hold per primary PT recommendation.   Consulted and Agree with Plan of Care Patient      Patient will benefit from skilled therapeutic intervention in order to improve the following deficits and impairments:  Abnormal gait, Decreased activity tolerance, Decreased balance, Decreased mobility, Difficulty walking, Decreased strength, Decreased range of motion, Impaired flexibility, Postural dysfunction, Pain  Visit Diagnosis: Pain in right hip  Muscle weakness (generalized)  Other abnormalities of gait and mobility     Problem List Patient Active Problem List   Diagnosis Date Noted  . Muscle spasm 11/26/2016   Hortencia Conradi, PTA  12/31/16, 4:17 PM [facility] [address] [place], [place], [address] Phone: [phone]   Fax:  [phone]  Name: Sara Hayes MRN: [id number] Date of Birth: 06/21/1971      PHYSICAL THERAPY DISCHARGE SUMMARY  Visits from Start of Care: 8  Current functional level related to goals / functional outcomes: See above   Remaining deficits: See above; PT provided little to no improvement in pain; and therefore recommended pt follow up with MD   Education / Equipment: HEP  Plan: Patient agrees to discharge.  Patient goals were partially met. Patient is being discharged due to lack of progress.  ?????    Clarita Crane, PT, DPT 03/23/17 8:35 AM  [facility] [address] [place], [place] [address]  [phone] (office) [phone] (fax)

## 2017-01-05 ENCOUNTER — Ambulatory Visit: Payer: Medicare Other | Admitting: Physical Therapy

## 2017-01-07 ENCOUNTER — Ambulatory Visit: Payer: Medicare Other | Admitting: Physical Therapy

## 2017-01-28 ENCOUNTER — Other Ambulatory Visit: Payer: Self-pay | Admitting: Internal Medicine

## 2017-01-28 DIAGNOSIS — Z1231 Encounter for screening mammogram for malignant neoplasm of breast: Secondary | ICD-10-CM

## 2017-01-30 ENCOUNTER — Ambulatory Visit
Admission: RE | Admit: 2017-01-30 | Discharge: 2017-01-30 | Disposition: A | Discharge status: Home / Self Care | Payer: Medicare Other | Source: Ambulatory Visit | Attending: Internal Medicine | Admitting: Internal Medicine

## 2017-01-30 DIAGNOSIS — Z1231 Encounter for screening mammogram for malignant neoplasm of breast: Secondary | ICD-10-CM

## 2017-03-10 ENCOUNTER — Encounter: Payer: Self-pay | Admitting: Diagnostic Neuroimaging

## 2017-03-10 ENCOUNTER — Ambulatory Visit (INDEPENDENT_AMBULATORY_CARE_PROVIDER_SITE_OTHER): Payer: Medicare Other | Admitting: Diagnostic Neuroimaging

## 2017-03-10 VITALS — BP 108/74 | HR 71 | Ht 67.0 in | Wt 202.4 lb

## 2017-03-10 DIAGNOSIS — M62838 Other muscle spasm: Secondary | ICD-10-CM

## 2017-03-10 DIAGNOSIS — G63 Polyneuropathy in diseases classified elsewhere: Secondary | ICD-10-CM

## 2017-03-10 DIAGNOSIS — M79604 Pain in right leg: Secondary | ICD-10-CM | POA: Diagnosis not present

## 2017-03-10 DIAGNOSIS — M3506 Sjogren syndrome with peripheral nervous system involvement: Secondary | ICD-10-CM

## 2017-03-10 DIAGNOSIS — M3509 Sicca syndrome with other organ involvement: Secondary | ICD-10-CM

## 2017-03-10 NOTE — Progress Notes (Signed)
[facility]  PATIENT: Sara Hayes DOB: 02/03/1971  REFERRING CLINICIAN: Lorenda Ishihara HISTORY FROM: patient REASON FOR VISIT: follow up   HISTORICAL  CHIEF COMPLAINT:  Chief Complaint  Patient presents with  . Follow-up    86month    HISTORY OF PRESENT ILLNESS:   UPDATE 03/10/17: Since last visit, sxs stable. Has been to PT without benefit. Has tried pain injection. 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 Otherwise sjogren's symptoms and imaging are stable.   NEW HPI (Dr. Marjory Lies, 11/26/16): 46 year old female with history of Sjgren syndrome with neurologic manifestations, here for evaluation second opinion. In 1980 patient was diagnosed with juvenile rheumatoid arthritis and rheumatic fever. She was treated with 2 months of some type of medication but then this was discontinued by patient's mother who opted to "turn to the lord" for healing the patient. By 2004 patient was having increasing numbness, tingling, weakness in her legs. She was found to have positive Sjogren's antibodies and started on Plaquenil. By 2008 she was evaluated at [facility] and her medications were continued to be adjusted. In 2013 patient developed left optic neuritis and was evaluated by Dr. Terrace Arabia. She then continued to follow-up at [facility] by multiple sclerosis and autoimmune specialist Dr. Bluford Kaufmann. Currently she is being managed by neurology and rheumatology at [facility]. Patient lives in [place] and due to difficulty in travel, patient is looking to establish with local neurologist again. Patient continues to have intermittent muscle jerks in her left shoulder and neck. Patient continues to have diffuse pain in her joints.  UPDATE (Dr. Bluford Kaufmann, [facility], 04/25/16): [I reviewed notes from 04/23/15 and 04/25/16: In summary Dr. Bluford Kaufmann reports that patient is 46 years old and history of arthritis and Sjogren's syndrome, who developed left optic neuritis on D10 or  sept, status post skin biopsy demonstrates small fiber neuropathy, and CSF testing showing 3 oligoclonal bands. Patient was being treated by rheumatology for Sjogren's syndrome with CNS and PNS manifestations. Neurologist Dr. Bluford Kaufmann was managing myoclonus symptoms with clonazepam.]  UPDATE (Dr. Terrace Arabia, 11/13/11): Visual evoked response test study on the right was normal. Study on the left showed no response. This study would be consistent with a significant lesion in the anterior visual pathway on the left, consistent with a severe optic neuritis or optic neuropathy. Poor patient cooperation may also produce no response. Clinical correlation is required. Lab showed +ANA, SSA/B, MRI of cervical showed degenerative disc disease, but no cord signal change, she was treated with IV steroids followed by p.o. prednisone tapering, now she reported 70% improvement of her left vision, still intermittent blurring, she was able to read fine print today, she continued to complain bilateral feet paresthesia. I have reviewed MRI brain again, there was two typical oval-shaped lesions, one is at periventricular region, the other is at right middle cerebellar peduncle, with her left optic neuritis, 2/4 lesions disseminated in space, the clinical isolated syndrome, likely progress to relapsing remitting multiple sclerosis, I have suggested lumbar puncture to look for oligoclonal banding, but she wants to talk with her neurologist in [place] before proceeding further, she is also under the her rheumatologist care and [place]  PRIOR HPI (Dr. Terrace Arabia, 10/17/11): 46 yo right-handed [place] American female, by neuro-hospitalist Dr. Creed Copper for evaluation of probable left optic neuritis. She has past medical history of rheumatoid arthritis, has been treated with Enbrel for one year, plaquenil 200 mg b.i.d. since 2004, and also methotrexate 2.5  mg 10 tablets every Thursday for 3 years, she stopped plaquenil 200mg  bid about 2 months  ago. Rheumatologist are at [facility]. In January 5th, 2013, she woke up noticed blurry vision, when she closed her right eye, she noticed dark grayness in the central and upper temporal part of her left eye, symptom has been persistent in the past few days, she was evaluated by Dr. Elmer Picker ophthalmologist, no structure lesion found, there was left upper temporal visual deficit enlarged central scotoma on visual field testing. She was later referred to emergency room, seen by Dr. Creed Copper, MRI of the brain with and without contrast has demonstrate to oval-shaped T2 and flare hyperdensity relations at the right periventricular, right middle cerebellar peduncle,, no contrast enhancement. She is referred for evaluation of possible multiple sclerosis.   REVIEW OF SYSTEMS: Full 14 system review of systems performed and negative with exception of: numbness weakness walking difficulty.   ALLERGIES: Allergies  Allergen Reactions  . Amoxicillin Hives  . Aspirin Hives  . Naproxen Other (See Comments)    Unknown, large blood clots  . Niacin And Related Other (See Comments)    flushing  . Penicillins Hives    HOME MEDICATIONS: Outpatient Medications Prior to Visit  Medication Sig Dispense Refill  . baclofen (LIORESAL) 10 MG tablet 10 mg to Cath Lab. 2-3 x daily    . Calcium Carbonate-Vitamin D (CALCIUM 600 + D PO) Take 2 tablets by mouth daily. Chocolate chewable    . carbamazepine (TEGRETOL) 200 MG tablet Take 200 mg by mouth 2 (two) times daily.    . clonazePAM (KLONOPIN) 0.5 MG tablet 0.5 mg 3 (three) times daily.    . cycloSPORINE (RESTASIS) 0.05 % ophthalmic emulsion Place 1 drop into both eyes 2 (two) times daily.    . fluticasone (FLOVENT HFA) 110 MCG/ACT inhaler Inhale 1 puff into the lungs 2 (two) times daily.    . folic acid (FOLVITE) 1 MG tablet Take 3 mg by mouth daily.    Marland Kitchen gabapentin (NEURONTIN) 300 MG capsule Take 900-1,200 mg by mouth 3 (three) times daily. 900 mg in am and midday, and  1200 mg at night    . hydroxychloroquine (PLAQUENIL) 200 MG tablet Take 400 mg by mouth daily.     . methotrexate 2.5 MG tablet Take 25 mg by mouth once a week.     . montelukast (SINGULAIR) 10 MG tablet Take 10 mg by mouth at bedtime.    . Multiple Vitamins-Minerals (MULTIVITAMINS THER. W/MINERALS) TABS Take 1 tablet by mouth daily.    . nortriptyline (PAMELOR) 25 MG capsule Take 75 mg by mouth at bedtime.    . pilocarpine (SALAGEN) 5 MG tablet Take 5 mg by mouth 3 (three) times daily.    Marland Kitchen PROAIR HFA 108 (90 Base) MCG/ACT inhaler     . sulfaSALAzine (AZULFIDINE) 500 MG EC tablet 1,000 mg 2 (two) times daily.    . vitamin C (ASCORBIC ACID) 500 MG tablet Take 500 mg by mouth daily.     No facility-administered medications prior to visit.     PAST MEDICAL HISTORY: Past Medical History:  Diagnosis Date  . Arthritis   . Asthma   . Neuropathy   . Ovarian cyst   . Sjogren's disease (HCC)   . Thyroid disease    11/2016 no longer taking med    PAST SURGICAL HISTORY: Past Surgical History:  Procedure Laterality Date  . TUBAL LIGATION      FAMILY HISTORY: Family History  Problem  Relation Age of Onset  . Breast cancer Maternal Grandmother     SOCIAL HISTORY:  Social History   Social History  . Marital status: Divorced    Spouse name: N/A  . Number of children: 2  . Years of education: 14   Occupational History  .      NA, disabled since 2004   Social History Main Topics  . Smoking status: Never Smoker  . Smokeless tobacco: Never Used  . Alcohol use No  . Drug use: No  . Sexual activity: Not on file   Other Topics Concern  . Not on file   Social History Narrative   Lives with one child   Caffeine use- tea occasionally      PHYSICAL EXAM  GENERAL EXAM/CONSTITUTIONAL: Vitals:  Vitals:   03/10/17 1302  BP: 108/74  Pulse: 71  Weight: 202 lb 6 oz (91.8 kg)  Height: 5\' 7"  (1.702 m)   Body mass index is 31.7 kg/m. No exam data present  Patient is in no  distress; well developed, nourished and groomed; neck is supple  CARDIOVASCULAR:  Examination of carotid arteries is normal; no carotid bruits  Regular rate and rhythm, no murmurs  Examination of peripheral vascular system by observation and palpation is normal  EYES:  Ophthalmoscopic exam of optic discs and posterior segments is normal; no papilledema or hemorrhages  MUSCULOSKELETAL:  Gait, strength, tone, movements noted in Neurologic exam below  NEUROLOGIC: MENTAL STATUS:  No flowsheet data found.  awake, alert, oriented to person, place and time  recent and remote memory intact  normal attention and concentration  language fluent, comprehension intact, naming intact,   fund of knowledge appropriate  CRANIAL NERVE:   2nd - no papilledema on fundoscopic exam  2nd, 3rd, 4th, 6th - pupils equal and reactive to light, visual fields full to confrontation, extraocular muscles intact, no nystagmus  5th - facial sensation symmetric  7th - facial strength symmetric  8th - hearing intact  9th - palate elevates symmetrically, uvula midline  11th - shoulder shrug symmetric  12th - tongue protrusion midline  MOTOR:   normal bulk and tone, full strength in the BUE, BLE  SENSORY:   normal and symmetric to light touch, temperature, vibration; EXCEPT DECR IN FEET AND ANKLES  COORDINATION:   finger-nose-finger, fine finger movements normal  REFLEXES:   deep tendon reflexes TRACE and symmetric; EXCEPT ABSENT AT ANKLES  GAIT/STATION:   narrow based gait; ANTALGIC SLOW GAIT; RIGHT GROIN PAIN AND LIMPING    DIAGNOSTIC DATA (LABS, IMAGING, TESTING) - I reviewed patient records, labs, notes, testing and imaging myself where available.  Lab Results  Component Value Date   WBC 5.8 02/07/2013   HGB 11.7 (L) 02/07/2013   HCT 34.3 (L) 02/07/2013   MCV 87.5 02/07/2013   PLT 205 02/07/2013      Component Value Date/Time   NA 141 02/07/2013 1128   K 4.2  02/07/2013 1128   CL 104 02/07/2013 1128   CO2 32 02/07/2013 1128   GLUCOSE 99 02/07/2013 1128   BUN 7 02/07/2013 1128   CREATININE 0.78 02/07/2013 1128   CALCIUM 9.5 02/07/2013 1128   GFRNONAA >90 02/07/2013 1128   GFRAA >90 02/07/2013 1128   No results found for: CHOL, HDL, LDLCALC, LDLDIRECT, TRIG, CHOLHDL No results found for: BJYN8G No results found for: VITAMINB12 No results found for: TSH   10/22/11 VEP: Impression: Visual evoked response test study on the right was normal. Study on the left  showed no response. This study would be consistent with a significant lesion in the anterior visual pathway on the left, consistent with a severe optic neuritis or optic neuropathy. Poor patient cooperation may also produce no response. Clinical correlation is required.  02/23/16 MRI brain [[facility]; per neurology note] - Mild T2 flair hyperintensities including pericallosal and infratentorial lesions, unchanged from July 2016. No abnormal enhancing lesions.    ASSESSMENT AND PLAN  46 y.o. year old female here with long history of autoimmune disease including juvenile rheumatoid arthritis, rheumatic fever, Sjogren's syndrome, with neurologic manifestations of Sjgren syndrome including left optic neuritis, white matter brain lesions, myoclonus, numbness and tingling. Overall her neurologic symptoms and MRI scans have been stable.   Dx: neurologic sjogren's disease (with CNS and PNS involvement)  1. Sjogren's syndrome with other organ involvement (HCC)   2. 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 Muscle spasm   4. Right leg pain      PLAN:  CNS / PNS sjogren's disease (established problem, stable) - continue MTX, plaquenil, sulfasalazine for sjogren's syndrome (per rheumatology Dr. Marshall Cork at [facility]) - continue clonazepam and baclofen for muscle spasms - fall risk --> use cane or walker as needed - monitor neurologic symptoms  Pituitary lesion/mass (new problem, no additional  workup) - follow up with endocrinology and neurosurgery at [facility]  Return if symptoms worsen or fail to improve. May follow up as needed here at [facility] neurology clinic.     Suanne Marker, MD 03/10/2017, 1:17 PM Certified in Neurology, Neurophysiology and Neuroimaging  [facility] [address], [address] [place], [place] [address] [phone]

## 2017-03-23 ENCOUNTER — Telehealth: Payer: Self-pay | Admitting: Diagnostic Neuroimaging

## 2017-03-23 NOTE — Telephone Encounter (Signed)
Noted, will forward to Dr Leta Baptist when he returns.

## 2017-03-23 NOTE — Telephone Encounter (Signed)
Pt said her neurologist in [place] is wanting her to have MRI.  She will have the order sent to Dr Mamie Nick. She said Dr Mamie Nick said he would order it so she would not have so far to travel. She is aware he is out of the office until Monday and this can wait until his return.

## 2017-03-26 NOTE — Telephone Encounter (Signed)
Received MRI lumbar spine order from patient's neurologist in [place], [place].  Placed on Dr [facility] desk for review and for order to be placed when he returns to office Monday.

## 2017-03-31 ENCOUNTER — Telehealth: Payer: Self-pay | Admitting: Diagnostic Neuroimaging

## 2017-03-31 NOTE — Telephone Encounter (Signed)
Asi from [facility] called stating she was returning the call to [facility], she is asking to be called back at [phone]

## 2017-03-31 NOTE — Telephone Encounter (Signed)
Called ordering provider for MRI lumbar spine to inquire if order has insurance PA #. LVM with number requesting call back.

## 2017-03-31 NOTE — Telephone Encounter (Signed)
Otho Bellows, Dr Huntoon's office and the staff stated RN was not available. Will have RN call this RN back.

## 2017-03-31 NOTE — Telephone Encounter (Signed)
Attempted to call back Asi, [facility]. LVM requesting call back.

## 2017-03-31 NOTE — Telephone Encounter (Addendum)
Called back and staff member stated ordering dr, Dr Alice Reichert was no longer with [facility].  This RN called patient who stated she was unaware. She stated she saw Dr Georgianne Fick approx 1 month ago. She had had an injection in her hip which didn't help her pain. She is scheduled for an injection in her lower back on 04/14/17, but Dr Georgianne Fick wanted to do MRI lumbar spine prior to the injection. That is why Dr Georgianne Fick ordered the MRI.  This RN stated she would discuss with Dr Leta Baptist because this office cannot schedule MRI unless ordering physician's office has done insurance prior authorization.   Called [facility] at different number listed on MRI order.  Spoke with Jeneen Rinks who stated that he would send RN and Dr a message to call this RN back.

## 2017-03-31 NOTE — Telephone Encounter (Signed)
Sara Hayes from Dr [facility] office called back to answer questions re: MRI please call her back at [phone] she will be in office until 5pm

## 2017-03-31 NOTE — Telephone Encounter (Signed)
Patient called office to notify RN that Dr. Alice Reichert is still with [facility] patient called office to confirm with their office.

## 2017-04-01 ENCOUNTER — Other Ambulatory Visit: Payer: Self-pay | Admitting: Internal Medicine

## 2017-04-01 DIAGNOSIS — M5416 Radiculopathy, lumbar region: Secondary | ICD-10-CM

## 2017-04-01 NOTE — Telephone Encounter (Signed)
Called [facility] and spoke with Keene Breath, Therapist, sports. Advised her this office cannot schedule patient's MRI order received by fax from a provider outside our office.  Asi stated that "other offices do". This RN stated it cannot be done per our office policy and asked to speak with person that is responsible at [facility] for authorizing MRIs. Asi stated "it is radiology downtown, but she's not having it there". Asi stated the patietn asked for the MRI order to be sent to this office so she could have MRI here. This RN stated she was aware of this, but this office cannot authorize it. Asi stated "Thank you" and ended the phone call. Will inform Dr Leta Baptist.

## 2017-04-02 NOTE — Telephone Encounter (Signed)
Late entry: MRI lumbar spine order was faxed to [place] [facility] yesterday. Patient is now scheduled for MRI on 04/12/17.

## 2017-04-12 ENCOUNTER — Ambulatory Visit
Admission: RE | Admit: 2017-04-12 | Discharge: 2017-04-12 | Disposition: A | Discharge status: Home / Self Care | Payer: Medicare Other | Source: Ambulatory Visit | Attending: Internal Medicine | Admitting: Internal Medicine

## 2017-04-12 DIAGNOSIS — M5416 Radiculopathy, lumbar region: Secondary | ICD-10-CM

## 2017-05-28 ENCOUNTER — Other Ambulatory Visit (HOSPITAL_COMMUNITY)
Admission: RE | Admit: 2017-05-28 | Discharge: 2017-05-28 | Disposition: A | Discharge status: Home / Self Care | Payer: Medicare Other | Source: Ambulatory Visit | Attending: Obstetrics and Gynecology | Admitting: Obstetrics and Gynecology

## 2017-05-28 ENCOUNTER — Other Ambulatory Visit (HOSPITAL_COMMUNITY)
Admission: RE | Admit: 2017-05-28 | Discharge: 2017-05-28 | Disposition: A | Discharge status: Home / Self Care | Payer: Medicare Other | Source: Ambulatory Visit | Attending: Nurse Practitioner | Admitting: Nurse Practitioner

## 2017-05-28 ENCOUNTER — Other Ambulatory Visit: Payer: Self-pay | Admitting: Nurse Practitioner

## 2017-05-28 DIAGNOSIS — Z124 Encounter for screening for malignant neoplasm of cervix: Secondary | ICD-10-CM | POA: Insufficient documentation

## 2017-05-29 LAB — CYTOLOGY - PAP
DIAGNOSIS: NEGATIVE
HPV (WINDOPATH): NOT DETECTED

## 2018-06-09 ENCOUNTER — Ambulatory Visit: Payer: Self-pay | Admitting: General Surgery

## 2018-06-10 ENCOUNTER — Other Ambulatory Visit: Payer: Self-pay

## 2018-06-10 ENCOUNTER — Encounter (HOSPITAL_COMMUNITY): Payer: Self-pay

## 2018-06-10 ENCOUNTER — Encounter (HOSPITAL_COMMUNITY)
Admission: RE | Admit: 2018-06-10 | Discharge: 2018-06-10 | Disposition: A | Discharge status: Home / Self Care | Payer: Medicare Other | Source: Ambulatory Visit | Attending: General Surgery | Admitting: General Surgery

## 2018-06-10 DIAGNOSIS — M3500 Sicca syndrome, unspecified: Secondary | ICD-10-CM | POA: Diagnosis not present

## 2018-06-10 DIAGNOSIS — E079 Disorder of thyroid, unspecified: Secondary | ICD-10-CM | POA: Diagnosis not present

## 2018-06-10 DIAGNOSIS — K8012 Calculus of gallbladder with acute and chronic cholecystitis without obstruction: Secondary | ICD-10-CM | POA: Diagnosis not present

## 2018-06-10 DIAGNOSIS — G35 Multiple sclerosis: Secondary | ICD-10-CM | POA: Diagnosis not present

## 2018-06-10 DIAGNOSIS — G629 Polyneuropathy, unspecified: Secondary | ICD-10-CM | POA: Diagnosis not present

## 2018-06-10 DIAGNOSIS — M069 Rheumatoid arthritis, unspecified: Secondary | ICD-10-CM | POA: Diagnosis not present

## 2018-06-10 DIAGNOSIS — J45909 Unspecified asthma, uncomplicated: Secondary | ICD-10-CM | POA: Diagnosis not present

## 2018-06-10 DIAGNOSIS — K8010 Calculus of gallbladder with chronic cholecystitis without obstruction: Secondary | ICD-10-CM | POA: Diagnosis present

## 2018-06-10 DIAGNOSIS — Z881 Allergy status to other antibiotic agents status: Secondary | ICD-10-CM | POA: Diagnosis not present

## 2018-06-10 DIAGNOSIS — Z886 Allergy status to analgesic agent status: Secondary | ICD-10-CM | POA: Diagnosis not present

## 2018-06-10 DIAGNOSIS — Z888 Allergy status to other drugs, medicaments and biological substances status: Secondary | ICD-10-CM | POA: Diagnosis not present

## 2018-06-10 DIAGNOSIS — Z803 Family history of malignant neoplasm of breast: Secondary | ICD-10-CM | POA: Diagnosis not present

## 2018-06-10 DIAGNOSIS — Z880 Allergy status to penicillin: Secondary | ICD-10-CM | POA: Diagnosis not present

## 2018-06-10 DIAGNOSIS — Z79899 Other long term (current) drug therapy: Secondary | ICD-10-CM | POA: Diagnosis not present

## 2018-06-10 HISTORY — DX: Calculus of bile duct without cholangitis or cholecystitis without obstruction: K80.50

## 2018-06-10 HISTORY — DX: Multiple sclerosis: G35

## 2018-06-10 LAB — COMPREHENSIVE METABOLIC PANEL
ALBUMIN: 4.2 g/dL (ref 3.5–5.0)
ALT: 13 U/L (ref 0–44)
ANION GAP: 8 (ref 5–15)
AST: 17 U/L (ref 15–41)
Alkaline Phosphatase: 85 U/L (ref 38–126)
BUN: 9 mg/dL (ref 6–20)
CO2: 31 mmol/L (ref 22–32)
Calcium: 9.6 mg/dL (ref 8.9–10.3)
Chloride: 105 mmol/L (ref 98–111)
Creatinine, Ser: 0.76 mg/dL (ref 0.44–1.00)
GFR calc non Af Amer: >60 mL/min (ref >60)
GLUCOSE: 100 mg/dL — AB (ref 70–99)
Potassium: 4.6 mmol/L (ref 3.5–5.1)
SODIUM: 144 mmol/L (ref 135–145)
Total Bilirubin: 0.4 mg/dL (ref 0.3–1.2)
Total Protein: 8.7 g/dL — ABNORMAL HIGH (ref 6.5–8.1)

## 2018-06-10 LAB — CBC WITH DIFFERENTIAL/PLATELET
BASOS PCT: 0 %
Basophils Absolute: 0 10*3/uL (ref 0.0–0.1)
EOS ABS: 0.1 10*3/uL (ref 0.0–0.7)
Eosinophils Relative: 2 %
HCT: 37.6 % (ref 36.0–46.0)
Hemoglobin: 12.3 g/dL (ref 12.0–15.0)
Lymphocytes Relative: 26 %
Lymphs Abs: 1.3 10*3/uL (ref 0.7–4.0)
MCH: 28.9 pg (ref 26.0–34.0)
MCHC: 32.7 g/dL (ref 30.0–36.0)
MCV: 88.3 fL (ref 78.0–100.0)
MONO ABS: 0.5 10*3/uL (ref 0.1–1.0)
MONOS PCT: 11 %
NEUTROS PCT: 61 %
Neutro Abs: 3.1 10*3/uL (ref 1.7–7.7)
Platelets: 261 10*3/uL (ref 150–400)
RBC: 4.26 MIL/uL (ref 3.87–5.11)
RDW: 12.7 % (ref 11.5–15.5)
WBC: 5 10*3/uL (ref 4.0–10.5)

## 2018-06-10 LAB — HCG, SERUM, QUALITATIVE: Preg, Serum: NEGATIVE

## 2018-06-10 MED ORDER — ENSURE PRE-SURGERY PO LIQD
296.0000 mL | Freq: Once | ORAL | Status: DC
  Filled 2018-06-10: qty 296

## 2018-06-10 NOTE — Patient Instructions (Addendum)
Sara Hayes  06/10/2018   Your procedure is scheduled on: 06-11-18   Report to [facility] Main  Entrance    Report to admitting at 8:30AM    Call this number if you have problems the morning of surgery [phone]     Remember: Do not eat food After Midnight. Only clear liquids after midnight. Nothing by mouth 3 hours prior to scheduled surgery. Please finish carbohydrate drink 3 hours prior to surgery at 7:30am!       Tees Toh Allowed                                                                     Foods Excluded  Coffee and tea, regular and decaf                             liquids that you cannot  Plain Jell-O in any flavor                                             see through such as: Fruit ices (not with fruit pulp)                                     milk, soups, orange juice  Iced Popsicles                                    All solid food Carbonated beverages, regular and diet                                    Cranberry, grape and apple juices Sports drinks like Gatorade Lightly seasoned clear broth or consume(fat free) Sugar, honey syrup  Sample Menu Breakfast                                Lunch                                     Supper Cranberry juice                    Beef broth                            Chicken broth Jell-O                                     Grape juice  Apple juice Coffee or tea                        Jell-O                                      Popsicle                                                Coffee or tea                        Coffee or tea  _____________________________________________________________________     Take these medicines the morning of surgery with A SIP OF WATER: clonazepam, Flovent inhaler, Pro-air inhaler                                 You may not have any metal on your body including hair pins and              piercings  Do not wear  jewelry, make-up, lotions, powders or perfumes, deodorant             Do not wear nail polish.  Do not shave  48 hours prior to surgery.     Do not bring valuables to the hospital. [place].  Contacts, dentures or bridgework may not be worn into surgery.     Patients discharged the day of surgery will not be allowed to drive home.  Name and phone number of your driver:  Special Instructions: N/A              Please read over the following fact sheets you were given: _____________________________________________________________________             [facility] - Preparing for Surgery Before surgery, you can play an important role.  Because skin is not sterile, your skin needs to be as free of germs as possible.  You can reduce the number of germs on your skin by washing with CHG (chlorahexidine gluconate) soap before surgery.  CHG is an antiseptic cleaner which kills germs and bonds with the skin to continue killing germs even after washing. Please DO NOT use if you have an allergy to CHG or antibacterial soaps.  If your skin becomes reddened/irritated stop using the CHG and inform your nurse when you arrive at Short Stay. Do not shave (including legs and underarms) for at least 48 hours prior to the first CHG shower.  You may shave your face/neck. Please follow these instructions carefully:  1.  Shower with CHG Soap the night before surgery and the  morning of Surgery.  2.  If you choose to wash your hair, wash your hair first as usual with your  normal  shampoo.  3.  After you shampoo, rinse your hair and body thoroughly to remove the  shampoo.                           4.  Use CHG as you would any other liquid soap.  You can apply chg directly  to the skin and wash                       Gently with a scrungie or clean washcloth.  5.  Apply the CHG Soap to your body ONLY FROM THE NECK DOWN.   Do not use on face/ open                            Wound or open sores. Avoid contact with eyes, ears mouth and genitals (private parts).                       Wash face,  Genitals (private parts) with your normal soap.             6.  Wash thoroughly, paying special attention to the area where your surgery  will be performed.  7.  Thoroughly rinse your body with warm water from the neck down.  8.  DO NOT shower/wash with your normal soap after using and rinsing off  the CHG Soap.                9.  Pat yourself dry with a clean towel.            10.  Wear clean pajamas.            11.  Place clean sheets on your bed the night of your first shower and do not  sleep with pets. Day of Surgery : Do not apply any lotions/deodorants the morning of surgery.  Please wear clean clothes to the hospital/surgery center.  FAILURE TO FOLLOW THESE INSTRUCTIONS MAY RESULT IN THE CANCELLATION OF YOUR SURGERY PATIENT SIGNATURE_________________________________  NURSE SIGNATURE__________________________________  ________________________________________________________________________

## 2018-06-11 ENCOUNTER — Ambulatory Visit (HOSPITAL_COMMUNITY): Payer: Medicare Other | Admitting: Anesthesiology

## 2018-06-11 ENCOUNTER — Encounter (HOSPITAL_COMMUNITY)
Admission: RE | Disposition: A | Discharge status: Home / Self Care | Payer: Self-pay | Source: Ambulatory Visit | Attending: General Surgery

## 2018-06-11 ENCOUNTER — Encounter (HOSPITAL_COMMUNITY): Payer: Self-pay

## 2018-06-11 ENCOUNTER — Ambulatory Visit (HOSPITAL_COMMUNITY)
Admission: RE | Admit: 2018-06-11 | Discharge: 2018-06-11 | Disposition: A | Discharge status: Home / Self Care | Payer: Medicare Other | Source: Ambulatory Visit | Attending: General Surgery | Admitting: General Surgery

## 2018-06-11 DIAGNOSIS — Z880 Allergy status to penicillin: Secondary | ICD-10-CM | POA: Insufficient documentation

## 2018-06-11 DIAGNOSIS — K8012 Calculus of gallbladder with acute and chronic cholecystitis without obstruction: Secondary | ICD-10-CM | POA: Insufficient documentation

## 2018-06-11 DIAGNOSIS — Z888 Allergy status to other drugs, medicaments and biological substances status: Secondary | ICD-10-CM | POA: Insufficient documentation

## 2018-06-11 DIAGNOSIS — Z79899 Other long term (current) drug therapy: Secondary | ICD-10-CM | POA: Insufficient documentation

## 2018-06-11 DIAGNOSIS — Z803 Family history of malignant neoplasm of breast: Secondary | ICD-10-CM | POA: Insufficient documentation

## 2018-06-11 DIAGNOSIS — G629 Polyneuropathy, unspecified: Secondary | ICD-10-CM | POA: Insufficient documentation

## 2018-06-11 DIAGNOSIS — Z881 Allergy status to other antibiotic agents status: Secondary | ICD-10-CM | POA: Insufficient documentation

## 2018-06-11 DIAGNOSIS — G35 Multiple sclerosis: Secondary | ICD-10-CM | POA: Diagnosis not present

## 2018-06-11 DIAGNOSIS — J45909 Unspecified asthma, uncomplicated: Secondary | ICD-10-CM | POA: Insufficient documentation

## 2018-06-11 DIAGNOSIS — E079 Disorder of thyroid, unspecified: Secondary | ICD-10-CM | POA: Insufficient documentation

## 2018-06-11 DIAGNOSIS — Z886 Allergy status to analgesic agent status: Secondary | ICD-10-CM | POA: Insufficient documentation

## 2018-06-11 DIAGNOSIS — M3500 Sicca syndrome, unspecified: Secondary | ICD-10-CM | POA: Insufficient documentation

## 2018-06-11 DIAGNOSIS — M069 Rheumatoid arthritis, unspecified: Secondary | ICD-10-CM | POA: Insufficient documentation

## 2018-06-11 HISTORY — PX: CHOLECYSTECTOMY: SHX55

## 2018-06-11 SURGERY — LAPAROSCOPIC CHOLECYSTECTOMY
Anesthesia: General | Site: Abdomen

## 2018-06-11 MED ORDER — PROPOFOL 10 MG/ML IV BOLUS
INTRAVENOUS | Status: DC | PRN
  Administered 2018-06-11: 160 mg via INTRAVENOUS

## 2018-06-11 MED ORDER — MEPERIDINE HCL 50 MG/ML IJ SOLN: 6.2500 mg | INTRAMUSCULAR | Status: DC | PRN

## 2018-06-11 MED ORDER — FENTANYL CITRATE (PF) 100 MCG/2ML IJ SOLN
INTRAMUSCULAR | Status: AC
  Filled 2018-06-11: qty 2

## 2018-06-11 MED ORDER — IBUPROFEN 800 MG PO TABS: 800.0000 mg | ORAL_TABLET | Freq: Three times a day (TID) | ORAL | 0 refills | Status: DC | PRN

## 2018-06-11 MED ORDER — MIDAZOLAM HCL 2 MG/2ML IJ SOLN
INTRAMUSCULAR | Status: AC
  Filled 2018-06-11: qty 2

## 2018-06-11 MED ORDER — OXYCODONE HCL 5 MG PO TABS
ORAL_TABLET | ORAL | Status: AC
  Filled 2018-06-11: qty 1

## 2018-06-11 MED ORDER — ROCURONIUM BROMIDE 10 MG/ML (PF) SYRINGE
PREFILLED_SYRINGE | INTRAVENOUS | Status: DC | PRN
  Administered 2018-06-11: 50 mg via INTRAVENOUS

## 2018-06-11 MED ORDER — CLINDAMYCIN PHOSPHATE 900 MG/50ML IV SOLN
900.0000 mg | INTRAVENOUS | Status: AC
  Administered 2018-06-11: 900 mg via INTRAVENOUS
  Filled 2018-06-11: qty 50

## 2018-06-11 MED ORDER — LIDOCAINE 2% (20 MG/ML) 5 ML SYRINGE
INTRAMUSCULAR | Status: DC | PRN
  Administered 2018-06-11: 100 mg via INTRAVENOUS

## 2018-06-11 MED ORDER — MIDAZOLAM HCL 5 MG/5ML IJ SOLN
INTRAMUSCULAR | Status: DC | PRN
  Administered 2018-06-11: 2 mg via INTRAVENOUS

## 2018-06-11 MED ORDER — FENTANYL CITRATE (PF) 100 MCG/2ML IJ SOLN
25.0000 ug | INTRAMUSCULAR | Status: DC | PRN
  Administered 2018-06-11 (×3): 50 ug via INTRAVENOUS

## 2018-06-11 MED ORDER — FENTANYL CITRATE (PF) 250 MCG/5ML IJ SOLN
INTRAMUSCULAR | Status: AC
  Filled 2018-06-11: qty 5

## 2018-06-11 MED ORDER — GABAPENTIN 300 MG PO CAPS
600.0000 mg | ORAL_CAPSULE | Freq: Once | ORAL | Status: AC
  Administered 2018-06-11: 600 mg via ORAL

## 2018-06-11 MED ORDER — HYDROCODONE-ACETAMINOPHEN 5-325 MG PO TABS: 1.0000 | ORAL_TABLET | Freq: Four times a day (QID) | ORAL | 0 refills | Status: DC | PRN

## 2018-06-11 MED ORDER — PROPOFOL 10 MG/ML IV BOLUS
INTRAVENOUS | Status: AC
  Filled 2018-06-11: qty 20

## 2018-06-11 MED ORDER — DEXAMETHASONE SODIUM PHOSPHATE 10 MG/ML IJ SOLN
INTRAMUSCULAR | Status: DC | PRN
  Administered 2018-06-11: 10 mg via INTRAVENOUS

## 2018-06-11 MED ORDER — ONDANSETRON HCL 4 MG/2ML IJ SOLN
INTRAMUSCULAR | Status: AC
  Filled 2018-06-11: qty 2

## 2018-06-11 MED ORDER — ONDANSETRON HCL 4 MG/2ML IJ SOLN: 4.0000 mg | Freq: Once | INTRAMUSCULAR | Status: DC | PRN

## 2018-06-11 MED ORDER — CHLORHEXIDINE GLUCONATE CLOTH 2 % EX PADS: 6.0000 | MEDICATED_PAD | Freq: Once | CUTANEOUS | Status: DC

## 2018-06-11 MED ORDER — BUPIVACAINE HCL (PF) 0.25 % IJ SOLN
INTRAMUSCULAR | Status: AC
  Filled 2018-06-11: qty 30

## 2018-06-11 MED ORDER — LIDOCAINE 2% (20 MG/ML) 5 ML SYRINGE
INTRAMUSCULAR | Status: AC
  Filled 2018-06-11: qty 10

## 2018-06-11 MED ORDER — GABAPENTIN 300 MG PO CAPS
300.0000 mg | ORAL_CAPSULE | ORAL | Status: AC
  Administered 2018-06-11: 300 mg via ORAL
  Filled 2018-06-11: qty 1

## 2018-06-11 MED ORDER — BUPIVACAINE-EPINEPHRINE 0.25% -1:200000 IJ SOLN: INTRAMUSCULAR | Status: DC | PRN

## 2018-06-11 MED ORDER — ONDANSETRON HCL 4 MG/2ML IJ SOLN
INTRAMUSCULAR | Status: DC | PRN
  Administered 2018-06-11: 4 mg via INTRAVENOUS

## 2018-06-11 MED ORDER — LIDOCAINE 2% (20 MG/ML) 5 ML SYRINGE
INTRAMUSCULAR | Status: DC | PRN
  Administered 2018-06-11: 1.5 mg/kg/h via INTRAVENOUS

## 2018-06-11 MED ORDER — SUGAMMADEX SODIUM 200 MG/2ML IV SOLN
INTRAVENOUS | Status: DC | PRN
  Administered 2018-06-11: 200 mg via INTRAVENOUS

## 2018-06-11 MED ORDER — 0.9 % SODIUM CHLORIDE (POUR BTL) OPTIME
TOPICAL | Status: DC | PRN
  Administered 2018-06-11: 1000 mL

## 2018-06-11 MED ORDER — OXYCODONE HCL 5 MG/5ML PO SOLN: 5.0000 mg | Freq: Once | ORAL | Status: AC | PRN

## 2018-06-11 MED ORDER — LACTATED RINGERS IR SOLN
Status: DC | PRN
  Administered 2018-06-11: 1000 mL

## 2018-06-11 MED ORDER — LACTATED RINGERS IV SOLN
INTRAVENOUS | Status: DC
  Administered 2018-06-11: 1000 mL via INTRAVENOUS

## 2018-06-11 MED ORDER — DEXAMETHASONE SODIUM PHOSPHATE 10 MG/ML IJ SOLN
INTRAMUSCULAR | Status: AC
  Filled 2018-06-11: qty 1

## 2018-06-11 MED ORDER — GABAPENTIN 300 MG PO CAPS
ORAL_CAPSULE | ORAL | Status: AC
  Filled 2018-06-11: qty 2

## 2018-06-11 MED ORDER — ACETAMINOPHEN 500 MG PO TABS
1000.0000 mg | ORAL_TABLET | ORAL | Status: AC
  Administered 2018-06-11: 1000 mg via ORAL
  Filled 2018-06-11: qty 2

## 2018-06-11 MED ORDER — OXYCODONE HCL 5 MG PO TABS
5.0000 mg | ORAL_TABLET | Freq: Once | ORAL | Status: AC | PRN
  Administered 2018-06-11: 5 mg via ORAL

## 2018-06-11 MED ORDER — BUPIVACAINE HCL (PF) 0.25 % IJ SOLN
INTRAMUSCULAR | Status: DC | PRN
  Administered 2018-06-11: 30 mL

## 2018-06-11 MED ORDER — FENTANYL CITRATE (PF) 100 MCG/2ML IJ SOLN
INTRAMUSCULAR | Status: DC | PRN
  Administered 2018-06-11 (×2): 50 ug via INTRAVENOUS
  Administered 2018-06-11: 100 ug via INTRAVENOUS
  Administered 2018-06-11 (×3): 50 ug via INTRAVENOUS

## 2018-06-11 SURGICAL SUPPLY — 40 items
APPLIER CLIP ROT 10 11.4 M/L (STAPLE)
BANDAGE ADH SHEER 1  50/CT (GAUZE/BANDAGES/DRESSINGS) ×2 IMPLANT
BENZOIN TINCTURE PRP APPL 2/3 (GAUZE/BANDAGES/DRESSINGS) ×2 IMPLANT
CABLE HIGH FREQUENCY MONO STRZ (ELECTRODE) ×2 IMPLANT
CATH CHOLANG 76X19 KUMAR (CATHETERS) ×2 IMPLANT
CHLORAPREP W/TINT 26ML (MISCELLANEOUS) ×2 IMPLANT
CLIP APPLIE ROT 10 11.4 M/L (STAPLE) IMPLANT
CLIP VESOLOCK LG 6/CT PURPLE (CLIP) IMPLANT
CLIP VESOLOCK MED LG 6/CT (CLIP) ×2 IMPLANT
COVER MAYO STAND STRL (DRAPES) ×2 IMPLANT
COVER SURGICAL LIGHT HANDLE (MISCELLANEOUS) ×2 IMPLANT
DECANTER SPIKE VIAL GLASS SM (MISCELLANEOUS) ×2 IMPLANT
DERMABOND ADVANCED (GAUZE/BANDAGES/DRESSINGS)
DERMABOND ADVANCED .7 DNX12 (GAUZE/BANDAGES/DRESSINGS) IMPLANT
DRAIN CHANNEL 19F RND (DRAIN) IMPLANT
DRSG TEGADERM 4X4.75 (GAUZE/BANDAGES/DRESSINGS) ×2 IMPLANT
EVACUATOR SILICONE 100CC (DRAIN) IMPLANT
GAUZE SPONGE 4X4 12PLY STRL (GAUZE/BANDAGES/DRESSINGS) ×2 IMPLANT
GLOVE BIOGEL PI IND STRL 7.0 (GLOVE) ×1 IMPLANT
GLOVE BIOGEL PI INDICATOR 7.0 (GLOVE) ×1
GLOVE SURG SS PI 7.0 STRL IVOR (GLOVE) ×2 IMPLANT
GOWN STRL REUS W/TWL LRG LVL3 (GOWN DISPOSABLE) ×2 IMPLANT
GOWN STRL REUS W/TWL XL LVL3 (GOWN DISPOSABLE) ×4 IMPLANT
GRASPER SUT TROCAR 14GX15 (MISCELLANEOUS) ×2 IMPLANT
KIT BASIN OR (CUSTOM PROCEDURE TRAY) ×2 IMPLANT
POUCH RETRIEVAL ECOSAC 10 (ENDOMECHANICALS) ×1 IMPLANT
POUCH RETRIEVAL ECOSAC 10MM (ENDOMECHANICALS) ×1
SCISSORS LAP 5X35 DISP (ENDOMECHANICALS) ×2 IMPLANT
SET IRRIG TUBING LAPAROSCOPIC (IRRIGATION / IRRIGATOR) ×2 IMPLANT
SHEARS HARMONIC ACE PLUS 36CM (ENDOMECHANICALS) IMPLANT
SLEEVE XCEL OPT CAN 5 100 (ENDOMECHANICALS) ×4 IMPLANT
STRIP CLOSURE SKIN 1/2X4 (GAUZE/BANDAGES/DRESSINGS) ×2 IMPLANT
SUT ETHILON 2 0 PS N (SUTURE) IMPLANT
SUT MNCRL AB 4-0 PS2 18 (SUTURE) ×2 IMPLANT
SUT VICRYL 0 ENDOLOOP (SUTURE) IMPLANT
TOWEL OR 17X26 10 PK STRL BLUE (TOWEL DISPOSABLE) ×2 IMPLANT
TRAY LAPAROSCOPIC (CUSTOM PROCEDURE TRAY) ×2 IMPLANT
TROCAR BLADELESS OPT 5 100 (ENDOMECHANICALS) ×2 IMPLANT
TROCAR XCEL NON-BLD 11X100MML (ENDOMECHANICALS) ×2 IMPLANT
TUBING INSUF HEATED (TUBING) ×2 IMPLANT

## 2018-06-11 NOTE — Anesthesia Postprocedure Evaluation (Signed)
Anesthesia Post Note  Patient: Sara Hayes  Procedure(s) Performed: LAPAROSCOPIC CHOLECYSTECTOMY (N/A Abdomen)     Patient location during evaluation: PACU Anesthesia Type: General Level of consciousness: awake and alert Pain management: pain level controlled Vital Signs Assessment: post-procedure vital signs reviewed and stable Respiratory status: spontaneous breathing, nonlabored ventilation, respiratory function stable and patient connected to nasal cannula oxygen Cardiovascular status: blood pressure returned to baseline and stable Postop Assessment: no apparent nausea or vomiting Anesthetic complications: no    Last Vitals:  Vitals:   06/11/18 1245 06/11/18 1300  BP: 140/88 (!) 147/90  Pulse: 73 76  Resp: 10 12  Temp:    SpO2: 100% 100%    Last Pain:  Vitals:   06/11/18 1300  TempSrc:   PainSc: 7                  Cammeron Greis E Zamere Pasternak

## 2018-06-11 NOTE — Anesthesia Procedure Notes (Signed)
Procedure Name: Intubation Date/Time: 06/11/2018 10:59 AM Performed by: Anne Fu, CRNA Pre-anesthesia Checklist: Patient identified, Emergency Drugs available, Suction available, Patient being monitored and Timeout performed Patient Re-evaluated:Patient Re-evaluated prior to induction Oxygen Delivery Method: Circle system utilized Preoxygenation: Pre-oxygenation with 100% oxygen Induction Type: IV induction Ventilation: Mask ventilation without difficulty Laryngoscope Size: Mac and 4 Grade View: Grade I Tube type: Oral Tube size: 7.5 mm Number of attempts: 1 Airway Equipment and Method: Stylet Placement Confirmation: ETT inserted through vocal cords under direct vision,  positive ETCO2 and breath sounds checked- equal and bilateral Secured at: 21 cm Tube secured with: Tape Dental Injury: Teeth and Oropharynx as per pre-operative assessment

## 2018-06-11 NOTE — Op Note (Signed)
PATIENT:  Sara Hayes  47 y.o. female  PRE-OPERATIVE DIAGNOSIS:  CHRONIC CALCULOUS CHOLECYSTITIS  POST-OPERATIVE DIAGNOSIS:  CHRONIC CALCULOUS CHOLECYSTITIS  PROCEDURE:  Procedure(s): LAPAROSCOPIC CHOLECYSTECTOMY   SURGEON:  Surgeon(s): Kinsinger, Arta Bruce, MD  ASSISTANT: none  ANESTHESIA:   local and general  Indications for procedure: [name] is a 47 y.o. female with symptoms of Abdominal pain and Nausea and vomiting consistent with gallbladder disease, Confirmed by Ultrasound.  Description of procedure: The patient was brought into the operative suite, placed supine. Anesthesia was administered with endotracheal tube. Patient was strapped in place and foot board was secured. All pressure points were offloaded by foam padding. The patient was prepped and draped in the usual sterile fashion.  A small incision was made to the right of the umbilicus. A 5mm trocar was inserted into the peritoneal cavity with optical entry. Pneumoperitoneum was applied with high flow low pressure. 2 20mm trocars were placed in the RUQ. A 53mm trocar was placed in the subxiphoid space. 30 ml marcaine was infused to the subxiphoid space and lateral upper right abdomen in the preperitoneal spaces. Next the patient was placed in reverse trendelenberg. The gallbladder was white and tensely distended. It was drained and a clear fluid was evacuated consistent with hydrops of the gallbladder.  The gallbladder was retracted cephalad and lateral. The peritoneum was reflected off the infundibulum working lateral to medial. The cystic duct and cystic artery were identified and further dissection revealed a critical view. The cystic duct and cystic artery were doubly clipped and ligated.   The gallbladder was removed off the liver bed with cautery. The Gallbladder was placed in a specimen bag. The gallbladder fossa was irrigated and hemostasis was applied with cautery. The gallbladder was removed via the  35mm trocar. The fascial defect was closed with interrupted 0 vicryl suture via laparoscopic trans-fascial suture passer. Pneumoperitoneum was removed, all trocar were removed. All incisions were closed with 4-0 monocryl subcuticular stitch. The patient woke from anesthesia and was brought to PACU in stable condition. All counts were correct  Findings: inflamed distended gallbladder with multiple large gallstones  Specimen: gallbladder  Blood loss: 30 ml  Local anesthesia: 30 ml marcaine  Complications: none  PLAN OF CARE: Discharge to home after PACU  PATIENT DISPOSITION:  PACU - hemodynamically stable.  Images:    Gurney Maxin, M.D. General, Bariatric, & Minimally Invasive Surgery [facility]

## 2018-06-11 NOTE — Anesthesia Preprocedure Evaluation (Signed)
Anesthesia Evaluation  Patient identified by MRN, date of birth, ID band Patient awake    Reviewed: Allergy & Precautions, NPO status , Patient's Chart, lab work & pertinent test results  History of Anesthesia Complications Negative for: history of anesthetic complications  Airway Mallampati: I  TM Distance: >3 FB Neck ROM: Full    Dental  (+) Lower Dentures   Pulmonary asthma ,    Pulmonary exam normal        Cardiovascular negative cardio ROS Normal cardiovascular exam     Neuro/Psych  Neuromuscular disease (Sjogren's with neurologic manifestations)    GI/Hepatic negative GI ROS, Neg liver ROS,   Endo/Other  negative endocrine ROS  Renal/GU negative Renal ROS  negative genitourinary   Musculoskeletal  (+) Arthritis , Rheumatoid disorders,    Abdominal   Peds  Hematology negative hematology ROS (+)   Anesthesia Other Findings   Reproductive/Obstetrics negative OB ROS                             Anesthesia Physical Anesthesia Plan  ASA: II  Anesthesia Plan: General   Post-op Pain Management:    Induction: Intravenous  PONV Risk Score and Plan: 4 or greater and Ondansetron, Dexamethasone, Treatment may vary due to age or medical condition and Midazolam  Airway Management Planned: Oral ETT  Additional Equipment:   Intra-op Plan:   Post-operative Plan: Extubation in OR  Informed Consent: I have reviewed the patients History and Physical, chart, labs and discussed the procedure including the risks, benefits and alternatives for the proposed anesthesia with the patient or authorized representative who has indicated his/her understanding and acceptance.     Plan Discussed with:   Anesthesia Plan Comments: (Discussed risk of temporary worsening of neuropathy after anesthesia.)        Anesthesia Quick Evaluation

## 2018-06-11 NOTE — Transfer of Care (Signed)
Immediate Anesthesia Transfer of Care Note  Patient: Sara Hayes  Procedure(s) Performed: Procedure(s): LAPAROSCOPIC CHOLECYSTECTOMY (N/A)  Patient Location: PACU  Anesthesia Type:General  Level of Consciousness:  sedated, patient cooperative and responds to stimulation  Airway & Oxygen Therapy:Patient Spontanous Breathing and Patient connected to face mask oxgen  Post-op Assessment:  Report given to PACU RN and Post -op Vital signs reviewed and stable  Post vital signs:  Reviewed and stable  Last Vitals:  Vitals:   06/11/18 0854 06/11/18 1235  BP: 119/82 (!) 146/77  Pulse: 97 76  Resp: 16 13  Temp: 36.7 C   SpO2: 52% 591%    Complications: No apparent anesthesia complications

## 2018-06-11 NOTE — H&P (Signed)
Sara Hayes is an 47 y.o. female.   Chief Complaint: abdominal pain HPI: 47 yo female with intermittent abdominal pain consistent with symptomatic gallstones. She had been managed well with dietary modification but now has had 1 week of constant pain.  Past Medical History:  Diagnosis Date  . Arthritis   . Asthma   . Biliary colic   . MS (multiple sclerosis) ([place])    december 2018 ; managed by [facility] in [place]   . Neuropathy   . Ovarian cyst   . Sjogren's disease ([place])   . Thyroid disease    11/2016 no longer taking med    Past Surgical History:  Procedure Laterality Date  . TUBAL LIGATION      Family History  Problem Relation Age of Onset  . Breast cancer Maternal Grandmother    Social History:  reports that she has never smoked. She has never used smokeless tobacco. She reports that she does not drink alcohol or use drugs.  Allergies:  Allergies  Allergen Reactions  . Amoxicillin Hives  . Penicillins Hives    Has patient had a PCN reaction causing immediate rash, facial/tongue/throat swelling, SOB or lightheadedness with hypotension: YES Has patient had a PCN reaction causing severe rash involving mucus membranes or skin necrosis: yes Has patient had a PCN reaction that required hospitalization: no Has patient had a PCN reaction occurring within the last 10 years: no If all of the above answers are "NO", then may proceed with Cephalosporin use.   . Aspirin Hives  . Naproxen Other (See Comments)    Unknown, large blood clots  . Niacin And Related Other (See Comments)    flushing    Medications Prior to Admission  Medication Sig Dispense Refill  . AUBAGIO 14 MG TABS Take 1 tablet by mouth daily.    . baclofen (LIORESAL) 10 MG tablet Take 10 mg by mouth 3 (three) times daily as needed for muscle spasms. 2-3 x daily    . Calcium Carbonate-Vitamin D (CALCIUM 600 + D PO) Take 2 tablets by mouth daily. Chocolate chewable    . carbamazepine  (TEGRETOL) 200 MG tablet Take 200 mg by mouth daily.     . clonazePAM (KLONOPIN) 0.5 MG tablet Take 0.5 mg by mouth 3 (three) times daily.     . cycloSPORINE (RESTASIS) 0.05 % ophthalmic emulsion Place 1 drop into both eyes 2 (two) times daily.    . fluticasone (FLOVENT HFA) 220 MCG/ACT inhaler Inhale 1 puff into the lungs 2 (two) times daily.     Marland Kitchen gabapentin (NEURONTIN) 300 MG capsule Take 900-1,200 mg by mouth 3 (three) times daily. 900 mg in am and midday, and 1200 mg at night    . hydroxychloroquine (PLAQUENIL) 200 MG tablet Take 400 mg by mouth daily.     . montelukast (SINGULAIR) 10 MG tablet Take 10 mg by mouth at bedtime.    . Multiple Vitamins-Minerals (MULTIVITAMINS THER. W/MINERALS) TABS Take 1 tablet by mouth daily.    . nortriptyline (PAMELOR) 75 MG capsule Take 75 mg by mouth at bedtime.     Marland Kitchen PROAIR HFA 108 (90 Base) MCG/ACT inhaler Inhale 2 puffs into the lungs every 4 (four) hours as needed for wheezing or shortness of breath.     . sulfaSALAzine (AZULFIDINE) 500 MG EC tablet Take 1,000 mg by mouth 2 (two) times daily.     . vitamin C (ASCORBIC ACID) 500 MG tablet Take 500 mg by mouth daily.    Marland Kitchen  pilocarpine (SALAGEN) 5 MG tablet Take 5 mg by mouth 3 (three) times daily as needed (dry mouth).       Results for orders placed or performed during the hospital encounter of 06/10/18 (from the past 48 hour(s))  hCG, serum, qualitative     Status: None   Collection Time: 06/10/18  4:17 PM  Result Value Ref Range   Preg, Serum NEGATIVE NEGATIVE    Comment:        THE SENSITIVITY OF THIS METHODOLOGY IS >10 mIU/mL. Performed at [facility], [place] [address], [place], [place] [address]   CBC WITH DIFFERENTIAL     Status: None   Collection Time: 06/10/18  4:17 PM  Result Value Ref Range   WBC 5.0 4.0 - 10.5 K/uL   RBC 4.26 3.87 - 5.11 MIL/uL   Hemoglobin 12.3 12.0 - 15.0 g/dL   HCT 37.6 36.0 - 46.0 %   MCV 88.3 78.0 - 100.0 fL   MCH 28.9 26.0 - 34.0 pg   MCHC  32.7 30.0 - 36.0 g/dL   RDW 12.7 11.5 - 15.5 %   Platelets 261 150 - 400 K/uL   Neutrophils Relative % 61 %   Neutro Abs 3.1 1.7 - 7.7 K/uL   Lymphocytes Relative 26 %   Lymphs Abs 1.3 0.7 - 4.0 K/uL   Monocytes Relative 11 %   Monocytes Absolute 0.5 0.1 - 1.0 K/uL   Eosinophils Relative 2 %   Eosinophils Absolute 0.1 0.0 - 0.7 K/uL   Basophils Relative 0 %   Basophils Absolute 0.0 0.0 - 0.1 K/uL    Comment: Performed at [facility], [place] [address] [place], [place] [address]  Comprehensive metabolic panel     Status: Abnormal   Collection Time: 06/10/18  4:17 PM  Result Value Ref Range   Sodium 144 135 - 145 mmol/L   Potassium 4.6 3.5 - 5.1 mmol/L   Chloride 105 98 - 111 mmol/L   CO2 31 22 - 32 mmol/L   Glucose, Bld 100 (H) 70 - 99 mg/dL   BUN 9 6 - 20 mg/dL   Creatinine, Ser 0.76 0.44 - 1.00 mg/dL   Calcium 9.6 8.9 - 10.3 mg/dL   Total Protein 8.7 (H) 6.5 - 8.1 g/dL   Albumin 4.2 3.5 - 5.0 g/dL   AST 17 15 - 41 U/L   ALT 13 0 - 44 U/L   Alkaline Phosphatase 85 38 - 126 U/L   Total Bilirubin 0.4 0.3 - 1.2 mg/dL   GFR calc non Af Amer >60 >60 mL/min   GFR calc Af Amer >60 >60 mL/min    Comment: (NOTE) The eGFR has been calculated using the CKD EPI equation. This calculation has not been validated in all clinical situations. eGFR's persistently <60 mL/min signify possible Chronic Kidney Disease.    Anion gap 8 5 - 15    Comment: Performed at [facility], [place] [address], [place], [place] [address]   No results found.  Review of Systems  Constitutional: Negative for chills and fever.  HENT: Negative for hearing loss.   Eyes: Negative for blurred vision and double vision.  Respiratory: Negative for cough and hemoptysis.   Cardiovascular: Negative for chest pain and palpitations.  Gastrointestinal: Positive for abdominal pain and nausea. Negative for vomiting.  Genitourinary: Negative for dysuria and urgency.  Musculoskeletal:  Negative for myalgias and neck pain.  Skin: Negative for itching and rash.  Neurological: Negative for dizziness,  tingling and headaches.  Endo/Heme/Allergies: Does not bruise/bleed easily.  Psychiatric/Behavioral: Negative for depression and suicidal ideas.    Blood pressure 119/82, pulse 97, temperature 98.1 F (36.7 C), temperature source Oral, resp. rate 16, height 5' 7"  (1.702 m), weight 94.3 kg, SpO2 98 %. Physical Exam  Vitals reviewed. Constitutional: She is oriented to person, place, and time. She appears well-developed and well-nourished.  HENT:  Head: Normocephalic and atraumatic.  Eyes: Pupils are equal, round, and reactive to light. Conjunctivae and EOM are normal.  Neck: Normal range of motion. Neck supple.  Cardiovascular: Normal rate and regular rhythm.  Respiratory: Effort normal and breath sounds normal.  GI: Soft. Bowel sounds are normal. She exhibits no distension. There is tenderness in the right upper quadrant and epigastric area.  Musculoskeletal: Normal range of motion.  Neurological: She is alert and oriented to person, place, and time.  Skin: Skin is warm and dry.  Psychiatric: She has a normal mood and affect. Her behavior is normal.     Assessment/Plan 47 yo female with chronic calc cholecystitis -lap chole -planned outpatient  Mickeal Skinner, MD 06/11/2018, 10:11 AM

## 2018-06-12 ENCOUNTER — Encounter (HOSPITAL_COMMUNITY): Payer: Self-pay | Admitting: General Surgery

## 2018-07-14 ENCOUNTER — Other Ambulatory Visit: Payer: Self-pay | Admitting: Internal Medicine

## 2018-07-14 DIAGNOSIS — Z1231 Encounter for screening mammogram for malignant neoplasm of breast: Secondary | ICD-10-CM

## 2018-08-13 ENCOUNTER — Ambulatory Visit
Admission: RE | Admit: 2018-08-13 | Discharge: 2018-08-13 | Disposition: A | Discharge status: Home / Self Care | Payer: Medicare Other | Source: Ambulatory Visit | Attending: Internal Medicine | Admitting: Internal Medicine

## 2018-08-13 DIAGNOSIS — Z1231 Encounter for screening mammogram for malignant neoplasm of breast: Secondary | ICD-10-CM

## 2018-08-16 ENCOUNTER — Other Ambulatory Visit: Payer: Self-pay | Admitting: Internal Medicine

## 2018-08-16 DIAGNOSIS — R928 Other abnormal and inconclusive findings on diagnostic imaging of breast: Secondary | ICD-10-CM

## 2018-08-19 ENCOUNTER — Ambulatory Visit
Admission: RE | Admit: 2018-08-19 | Discharge: 2018-08-19 | Disposition: A | Discharge status: Home / Self Care | Payer: Medicare Other | Source: Ambulatory Visit | Attending: Internal Medicine | Admitting: Internal Medicine

## 2018-08-19 ENCOUNTER — Other Ambulatory Visit: Payer: Self-pay | Admitting: Internal Medicine

## 2018-08-19 DIAGNOSIS — R928 Other abnormal and inconclusive findings on diagnostic imaging of breast: Secondary | ICD-10-CM

## 2018-08-19 DIAGNOSIS — N63 Unspecified lump in unspecified breast: Secondary | ICD-10-CM

## 2018-11-22 ENCOUNTER — Other Ambulatory Visit: Payer: Medicare Other

## 2019-08-16 ENCOUNTER — Inpatient Hospital Stay: Admission: RE | Admit: 2019-08-16 | Payer: Medicare Other | Source: Ambulatory Visit

## 2019-08-16 ENCOUNTER — Other Ambulatory Visit: Payer: Medicare Other

## 2019-08-18 ENCOUNTER — Ambulatory Visit
Admission: RE | Admit: 2019-08-18 | Discharge: 2019-08-18 | Disposition: A | Discharge status: Home / Self Care | Payer: Medicare Other | Source: Ambulatory Visit | Attending: Internal Medicine | Admitting: Internal Medicine

## 2019-08-18 ENCOUNTER — Other Ambulatory Visit: Payer: Self-pay

## 2019-08-18 DIAGNOSIS — N63 Unspecified lump in unspecified breast: Secondary | ICD-10-CM

## 2020-07-11 ENCOUNTER — Encounter: Payer: Self-pay | Admitting: *Deleted

## 2020-07-16 ENCOUNTER — Encounter: Payer: Self-pay | Admitting: Diagnostic Neuroimaging

## 2020-07-16 ENCOUNTER — Ambulatory Visit (INDEPENDENT_AMBULATORY_CARE_PROVIDER_SITE_OTHER): Payer: Medicare Other | Admitting: Diagnostic Neuroimaging

## 2020-07-16 VITALS — BP 121/82 | HR 68 | Ht 66.0 in | Wt 193.0 lb

## 2020-07-16 DIAGNOSIS — M3507 Sjogren syndrome with central nervous system involvement: Secondary | ICD-10-CM

## 2020-07-16 DIAGNOSIS — M069 Rheumatoid arthritis, unspecified: Secondary | ICD-10-CM

## 2020-07-16 DIAGNOSIS — M25551 Pain in right hip: Secondary | ICD-10-CM | POA: Diagnosis not present

## 2020-07-16 DIAGNOSIS — M5441 Lumbago with sciatica, right side: Secondary | ICD-10-CM

## 2020-07-16 DIAGNOSIS — G8929 Other chronic pain: Secondary | ICD-10-CM

## 2020-07-16 DIAGNOSIS — G35 Multiple sclerosis: Secondary | ICD-10-CM

## 2020-07-16 DIAGNOSIS — M3506 Sjogren syndrome with peripheral nervous system involvement: Secondary | ICD-10-CM

## 2020-07-16 NOTE — Progress Notes (Signed)
[facility]  PATIENT: Sara Hayes DOB: [birth year]  REFERRING CLINICIAN: Lorenda Ishihara HISTORY FROM: patient REASON FOR VISIT: new consult / est patient   HISTORICAL  CHIEF COMPLAINT:  Chief Complaint  Patient presents with   Severe back pain    rm 7 "the last time I had back pain I had a new MS lesion, MS still managed by [facility]"    HISTORY OF PRESENT ILLNESS:   UPDATE (07/16/20, VRP): Since last visit, here for evaluation of low back pain. Has had flare ups over the years; last MRI lumbar spine in March 2021. No alleviating or aggravating factors. Tolerating meds. Now has dx: of multiple sclerosis, on aubagio.   UPDATE 03/10/17: Since last visit, sxs stable. Has been to PT without benefit. Has tried pain injection. 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 Otherwise sjogren's symptoms and imaging are stable.   NEW HPI (Dr. Marjory Lies, 11/26/16): 49 year old female with history of Sjgren syndrome with neurologic manifestations, here for evaluation second opinion. In 1980 patient was diagnosed with juvenile rheumatoid arthritis and rheumatic fever. She was treated with 2 months of some type of medication but then this was discontinued by patient's mother who opted to "turn to the lord" for healing the patient. By 2004 patient was having increasing numbness, tingling, weakness in her legs. She was found to have positive Sjogren's antibodies and started on Plaquenil. By 2008 she was evaluated at [facility] and her medications were continued to be adjusted. In 2013 patient developed left optic neuritis and was evaluated by Dr. Terrace Arabia. She then continued to follow-up at [facility] by multiple sclerosis and autoimmune specialist Dr. Bluford Kaufmann. Currently she is being managed by neurology and rheumatology at [facility]. Patient lives in [place] and due to difficulty in travel, patient is looking to establish with local neurologist again.  Patient continues to have intermittent muscle jerks in her left shoulder and neck. Patient continues to have diffuse pain in her joints.  UPDATE (Dr. Bluford Kaufmann, [facility], 04/25/16): [I reviewed notes from 04/23/15 and 04/25/16: In summary Dr. Bluford Kaufmann reports that patient is 49 years old and history of arthritis and Sjogren's syndrome, who developed left optic neuritis on D10 or sept, status post skin biopsy demonstrates small fiber neuropathy, and CSF testing showing 3 oligoclonal bands. Patient was being treated by rheumatology for Sjogren's syndrome with CNS and PNS manifestations. Neurologist Dr. Bluford Kaufmann was managing myoclonus symptoms with clonazepam.]  UPDATE (Dr. Terrace Arabia, 11/13/11): Visual evoked response test study on the right was normal. Study on the left showed no response. This study would be consistent with a significant lesion in the anterior visual pathway on the left, consistent with a severe optic neuritis or optic neuropathy. Poor patient cooperation may also produce no response. Clinical correlation is required. Lab showed +ANA, SSA/B, MRI of cervical showed degenerative disc disease, but no cord signal change, she was treated with IV steroids followed by p.o. prednisone tapering, now she reported 70% improvement of her left vision, still intermittent blurring, she was able to read fine print today, she continued to complain bilateral feet paresthesia. I have reviewed MRI brain again, there was two typical oval-shaped lesions, one is at periventricular region, the other is at right middle cerebellar peduncle, with her left optic neuritis, 2/4 lesions disseminated in space, the clinical isolated syndrome, likely progress to relapsing remitting multiple sclerosis, I have suggested lumbar puncture to look for oligoclonal banding, but she wants to talk  with her neurologist in [place] before proceeding further, she is also under the her rheumatologist care and [place]  PRIOR HPI (Dr. Terrace Arabia, 10/17/11): 49 yo right-handed  [place] female, by neuro-hospitalist Dr. Creed Copper for evaluation of probable left optic neuritis. She has past medical history of rheumatoid arthritis, has been treated with Enbrel for one year, plaquenil 200 mg b.i.d. since 2004, and also methotrexate 2.5 mg 10 tablets every Thursday for 3 years, she stopped plaquenil 200mg  bid about 2 months ago. Rheumatologist are at [facility] In January 5th, 2013, she woke up noticed blurry vision, when she closed her right eye, she noticed dark grayness in the central and upper temporal part of her left eye, symptom has been persistent in the past few days, she was evaluated by Dr. Elmer Picker ophthalmologist, no structure lesion found, there was left upper temporal visual deficit enlarged central scotoma on visual field testing. She was later referred to emergency room, seen by Dr. Creed Copper, MRI of the brain with and without contrast has demonstrate to oval-shaped T2 and flare hyperdensity relations at the right periventricular, right middle cerebellar peduncle,, no contrast enhancement. She is referred for evaluation of possible multiple sclerosis.   REVIEW OF SYSTEMS: Full 14 system review of systems performed and negative with exception of: numbness weakness walking difficulty.   ALLERGIES: Allergies  Allergen Reactions   Amoxicillin Hives   Penicillins Hives    Has patient had a PCN reaction causing immediate rash, facial/tongue/throat swelling, SOB or lightheadedness with hypotension: YES Has patient had a PCN reaction causing severe rash involving mucus membranes or skin necrosis: yes Has patient had a PCN reaction that required hospitalization: no Has patient had a PCN reaction occurring within the last 10 years: no If all of the above answers are "NO", then may proceed with Cephalosporin use.    Aspirin Hives   Naproxen Other (See Comments)    Unknown, large blood clots   Niacin And Related Other (See Comments)    flushing     HOME MEDICATIONS: Outpatient Medications Prior to Visit  Medication Sig Dispense Refill   Acetaminophen (TYLENOL ARTHRITIS PAIN PO) Take by mouth.     AUBAGIO 14 MG TABS Take 1 tablet by mouth daily.     baclofen (LIORESAL) 10 MG tablet Take 10 mg by mouth 3 (three) times daily as needed for muscle spasms. 2-3 x daily     clonazePAM (KLONOPIN) 0.5 MG tablet Take 0.5 mg by mouth 3 (three) times daily.      cycloSPORINE (RESTASIS) 0.05 % ophthalmic emulsion Place 1 drop into both eyes 2 (two) times daily.     fluticasone (FLOVENT HFA) 220 MCG/ACT inhaler Inhale 1 puff into the lungs 2 (two) times daily.      gabapentin (NEURONTIN) 300 MG capsule Take 900-1,200 mg by mouth 3 (three) times daily. 900 mg in am and midday, and 1200 mg at night     hydroxychloroquine (PLAQUENIL) 200 MG tablet Take 400 mg by mouth daily.      montelukast (SINGULAIR) 10 MG tablet Take 10 mg by mouth at bedtime.     nortriptyline (PAMELOR) 75 MG capsule Take 75 mg by mouth at bedtime.      pilocarpine (SALAGEN) 5 MG tablet Take 5 mg by mouth 3 (three) times daily as needed (dry mouth).      PROAIR HFA 108 (90 Base) MCG/ACT inhaler Inhale 2 puffs into the lungs every 4 (four) hours as needed for wheezing or shortness of breath.  sulfaSALAzine (AZULFIDINE) 500 MG EC tablet Take 1,000 mg by mouth 2 (two) times daily.      carbamazepine (TEGRETOL) 200 MG tablet Take 200 mg by mouth daily.  (Patient not taking: Reported on 07/16/2020)     Calcium Carbonate-Vitamin D (CALCIUM 600 + D PO) Take 2 tablets by mouth daily. Chocolate chewable     HYDROcodone-acetaminophen (NORCO/VICODIN) 5-325 MG tablet Take 1 tablet by mouth every 6 (six) hours as needed for moderate pain. 20 tablet 0   ibuprofen (ADVIL,MOTRIN) 800 MG tablet Take 1 tablet (800 mg total) by mouth every 8 (eight) hours as needed. 30 tablet 0   Multiple Vitamins-Minerals (MULTIVITAMINS THER. W/MINERALS) TABS Take 1 tablet by mouth daily.      vitamin C (ASCORBIC ACID) 500 MG tablet Take 500 mg by mouth daily.     No facility-administered medications prior to visit.    PAST MEDICAL HISTORY: 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 41   Asthma    Biliary colic    MS (multiple sclerosis) (HCC)    december 2018 ; managed by [facility] [place]    Myoclonus    Neuropathy    Ovarian cyst    Pituitary tumor    Rheumatic fever    age 83   Sickle cell trait (HCC)    Sjogren's disease (HCC)    Thyroid disease    11/2016 no longer taking med    PAST SURGICAL HISTORY: Past Surgical History:  Procedure Laterality Date   CHOLECYSTECTOMY N/A 06/11/2018   Procedure: LAPAROSCOPIC CHOLECYSTECTOMY;  Surgeon: Kinsinger, De Blanch, MD;  Location: [facility];  Service: General;  Laterality: N/A;   TUBAL LIGATION      FAMILY HISTORY: Family History  Problem Relation Age of Onset   Breast cancer Paternal Grandmother     SOCIAL HISTORY:  Social History   Socioeconomic History   Marital status: Divorced    Spouse name: Not on file   Number of children: 2   Years of education: 14   Highest education level: Not on file  Occupational History    Comment: NA, disabled since 2004  Tobacco Use   Smoking status: Never Smoker   Smokeless tobacco: Never Used  Substance and Sexual Activity   Alcohol use: Yes    Comment: occas   Drug use: No   Sexual activity: Not on file  Other Topics Concern   Not on file  Social History Narrative   Son Lives with her   Caffeine use- tea occasionally    Social Determinants of Health   Financial Resource Strain:    Difficulty of Paying Living Expenses: Not on file  Food Insecurity:    Worried About Programme researcher, broadcasting/film/video in the Last Year: Not on file   [employer] of Food in the Last Year: Not on file  Transportation Needs:    Lack of Transportation (Medical): Not on file   Lack of Transportation (Non-Medical): Not on  file  Physical Activity:    Days of Exercise per Week: Not on file   Minutes of Exercise per Session: Not on file  Stress:    Feeling of Stress : Not on file  Social Connections:    Frequency of Communication with Friends and Family: Not on file   Frequency of Social Gatherings with Friends and Family: Not on file   Attends Religious Services: Not on file   Active Member of Clubs or Organizations:  Not on file   Attends Club or Organization Meetings: Not on file   Marital Status: Not on file  Intimate Partner Violence:    Fear of Current or Ex-Partner: Not on file   Emotionally Abused: Not on file   Physically Abused: Not on file   Sexually Abused: Not on file     PHYSICAL EXAM  GENERAL EXAM/CONSTITUTIONAL: Vitals:  Vitals:   07/16/20 0954  BP: 121/82  Pulse: 68  Weight: 193 lb (87.5 kg)  Height: 5\' 6"  (1.676 m)   Body mass index is 31.15 kg/m. No exam data present  Patient is in no distress; well developed, nourished and groomed; neck is supple  CARDIOVASCULAR:  Examination of carotid arteries is normal; no carotid bruits  Regular rate and rhythm, no murmurs  Examination of peripheral vascular system by observation and palpation is normal  EYES:  Ophthalmoscopic exam of optic discs and posterior segments is normal; no papilledema or hemorrhages  MUSCULOSKELETAL:  Gait, strength, tone, movements noted in Neurologic exam below  NEUROLOGIC: MENTAL STATUS:  No flowsheet data found.  awake, alert, oriented to person, place and time  recent and remote memory intact  normal attention and concentration  language fluent, comprehension intact, naming intact,   fund of knowledge appropriate  CRANIAL NERVE:   2nd - no papilledema on fundoscopic exam  2nd, 3rd, 4th, 6th - pupils equal and reactive to light, visual fields full to confrontation, extraocular muscles intact, no nystagmus  5th - facial sensation symmetric  7th - facial strength  symmetric  8th - hearing intact  9th - palate elevates symmetrically, uvula midline  11th - shoulder shrug symmetric  12th - tongue protrusion midline  MOTOR:   normal bulk and tone, full strength in the BUE, BLE  SENSORY:   normal and symmetric to light touch, temperature, vibration; EXCEPT DECR IN FEET AND ANKLES  COORDINATION:   finger-nose-finger, fine finger movements normal  REFLEXES:   deep tendon reflexes TRACE and symmetric; EXCEPT ABSENT AT ANKLES  GAIT/STATION:   narrow based gait; ANTALGIC SLOW GAIT    DIAGNOSTIC DATA (LABS, IMAGING, TESTING) - I reviewed patient records, labs, notes, testing and imaging myself where available.  Lab Results  Component Value Date   WBC 5.0 06/10/2018   HGB 12.3 06/10/2018   HCT 37.6 06/10/2018   MCV 88.3 06/10/2018   PLT 261 06/10/2018      Component Value Date/Time   NA 144 06/10/2018 1617   K 4.6 06/10/2018 1617   CL 105 06/10/2018 1617   CO2 31 06/10/2018 1617   GLUCOSE 100 (H) 06/10/2018 1617   BUN 9 06/10/2018 1617   CREATININE 0.76 06/10/2018 1617   CALCIUM 9.6 06/10/2018 1617   PROT 8.7 (H) 06/10/2018 1617   ALBUMIN 4.2 06/10/2018 1617   AST 17 06/10/2018 1617   ALT 13 06/10/2018 1617   ALKPHOS 85 06/10/2018 1617   BILITOT 0.4 06/10/2018 1617   GFRNONAA >60 06/10/2018 1617   GFRAA >60 06/10/2018 1617   No results found for: CHOL, HDL, LDLCALC, LDLDIRECT, TRIG, CHOLHDL No results found for: ZOXW9U No results found for: VITAMINB12 No results found for: TSH   10/22/11 VEP: Impression: Visual evoked response test study on the right was normal. Study on the left showed no response. This study would be consistent with a significant lesion in the anterior visual pathway on the left, consistent with a severe optic neuritis or optic neuropathy. Poor patient cooperation may also produce no response. Clinical correlation is  required.  02/23/16 MRI brain [[facility]; per neurology note] - Mild T2 flair  hyperintensities including pericallosal and infratentorial lesions, unchanged from July 2016. No abnormal enhancing lesions.    ASSESSMENT AND PLAN  49 y.o. year old female here with long history of autoimmune disease including juvenile rheumatoid arthritis, rheumatic fever, Sjogren's syndrome, with neurologic manifestations of Sjgren syndrome including left optic neuritis, white matter brain lesions, myoclonus, numbness and tingling. Now also dx'd with multiple sclerosis and rheumatoid arthritis.   Dx:  1. Chronic right-sided low back pain with right-sided sciatica   2. Right hip pain   3. Sjogren's syndrome with central nervous system involvement   4. Sjogren's syndrome with peripheral nervous system involvement   5. Multiple sclerosis (HCC)   6. Rheumatoid arthritis involving multiple sites, unspecified whether rheumatoid factor present (HCC)      PLAN:  LOW BACK PAIN (intermittent flare ups; right hip pain; may need right hip surgery) - last MRI lumbar in March 2021; continue supportive care; follow up with orthopedic and neurosurgery clinics ([facility])  CNS / PNS sjogren's disease + rheumatoid arthritis - continue plaquenil (per rheumatology Dr. Marshall Cork at [facility]) - continue clonazepam and baclofen for muscle spasms - fall risk --> use cane or walker as needed - monitor neurologic symptoms  MULTIPLE SCLEROSIS - diagnoses in 2019; on aubagio (Dr. Bluford Kaufmann at [facility])  Pituitary lesion/mass (new problem, no additional workup) - follow up with endocrinology and neurosurgery at [facility]  Return for return to PCP. Follow up with [facility] rheumatology, neurology, orthopedic clinics.     Suanne Marker, MD 07/16/2020, 10:37 AM Certified in Neurology, Neurophysiology and Neuroimaging  [facility] [address], [address] [place], [place] [address] [phone]

## 2020-07-16 NOTE — Patient Instructions (Signed)
LOW BACK PAIN (intermittent flare ups; right hip pain; may need right hip surgery) - last MRI lumbar in March 2021; continue supportive care; follow up with orthopedic and neurosurgery clinics ([facility])  CNS / PNS sjogren's disease + rheumatoid arthritis - continue plaquenil (per rheumatology Dr. Tyrone Schimke at [facility]) - continue clonazepam and baclofen for muscle spasms - fall risk --> use cane or walker as needed - monitor neurologic symptoms  MULTIPLE SCLEROSIS - diagnoses in 2019; on aubagio (Dr. Candace Cruise at [facility])  Pituitary lesion/mass (new problem, no additional workup) - follow up with endocrinology and neurosurgery at [facility]

## 2020-09-04 ENCOUNTER — Other Ambulatory Visit: Payer: Self-pay | Admitting: Internal Medicine

## 2020-09-04 DIAGNOSIS — Z1231 Encounter for screening mammogram for malignant neoplasm of breast: Secondary | ICD-10-CM

## 2020-09-10 ENCOUNTER — Ambulatory Visit
Admission: RE | Admit: 2020-09-10 | Discharge: 2020-09-10 | Disposition: A | Discharge status: Home / Self Care | Payer: Medicare Other | Source: Ambulatory Visit | Attending: Internal Medicine | Admitting: Internal Medicine

## 2020-09-10 ENCOUNTER — Other Ambulatory Visit: Payer: Self-pay

## 2020-09-10 DIAGNOSIS — Z1231 Encounter for screening mammogram for malignant neoplasm of breast: Secondary | ICD-10-CM

## 2020-11-13 DIAGNOSIS — J4520 Mild intermittent asthma, uncomplicated: Secondary | ICD-10-CM | POA: Diagnosis not present

## 2020-11-13 DIAGNOSIS — D508 Other iron deficiency anemias: Secondary | ICD-10-CM | POA: Diagnosis not present

## 2020-11-13 DIAGNOSIS — E039 Hypothyroidism, unspecified: Secondary | ICD-10-CM | POA: Diagnosis not present

## 2020-11-13 DIAGNOSIS — M1610 Unilateral primary osteoarthritis, unspecified hip: Secondary | ICD-10-CM | POA: Diagnosis not present

## 2020-11-13 DIAGNOSIS — M069 Rheumatoid arthritis, unspecified: Secondary | ICD-10-CM | POA: Diagnosis not present

## 2020-11-20 DIAGNOSIS — M0880 Other juvenile arthritis, unspecified site: Secondary | ICD-10-CM | POA: Diagnosis not present

## 2021-01-04 DIAGNOSIS — R5382 Chronic fatigue, unspecified: Secondary | ICD-10-CM | POA: Diagnosis not present

## 2021-01-04 DIAGNOSIS — D508 Other iron deficiency anemias: Secondary | ICD-10-CM | POA: Diagnosis not present

## 2021-01-04 DIAGNOSIS — D649 Anemia, unspecified: Secondary | ICD-10-CM | POA: Diagnosis not present

## 2021-01-14 DIAGNOSIS — E039 Hypothyroidism, unspecified: Secondary | ICD-10-CM | POA: Diagnosis not present

## 2021-01-14 DIAGNOSIS — M1610 Unilateral primary osteoarthritis, unspecified hip: Secondary | ICD-10-CM | POA: Diagnosis not present

## 2021-01-14 DIAGNOSIS — M069 Rheumatoid arthritis, unspecified: Secondary | ICD-10-CM | POA: Diagnosis not present

## 2021-01-14 DIAGNOSIS — J4520 Mild intermittent asthma, uncomplicated: Secondary | ICD-10-CM | POA: Diagnosis not present

## 2021-01-14 DIAGNOSIS — D508 Other iron deficiency anemias: Secondary | ICD-10-CM | POA: Diagnosis not present

## 2021-02-01 DIAGNOSIS — H04123 Dry eye syndrome of bilateral lacrimal glands: Secondary | ICD-10-CM | POA: Diagnosis not present

## 2021-02-01 DIAGNOSIS — M3500 Sicca syndrome, unspecified: Secondary | ICD-10-CM | POA: Diagnosis not present

## 2021-02-01 DIAGNOSIS — H469 Unspecified optic neuritis: Secondary | ICD-10-CM | POA: Diagnosis not present

## 2021-02-01 DIAGNOSIS — Z79899 Other long term (current) drug therapy: Secondary | ICD-10-CM | POA: Diagnosis not present

## 2021-02-01 DIAGNOSIS — H524 Presbyopia: Secondary | ICD-10-CM | POA: Diagnosis not present

## 2021-02-12 DIAGNOSIS — Z91040 Latex allergy status: Secondary | ICD-10-CM | POA: Diagnosis not present

## 2021-02-12 DIAGNOSIS — Z886 Allergy status to analgesic agent status: Secondary | ICD-10-CM | POA: Diagnosis not present

## 2021-02-12 DIAGNOSIS — D649 Anemia, unspecified: Secondary | ICD-10-CM | POA: Diagnosis not present

## 2021-02-12 DIAGNOSIS — Z7951 Long term (current) use of inhaled steroids: Secondary | ICD-10-CM | POA: Diagnosis not present

## 2021-02-12 DIAGNOSIS — M0800 Unspecified juvenile rheumatoid arthritis of unspecified site: Secondary | ICD-10-CM | POA: Diagnosis not present

## 2021-02-12 DIAGNOSIS — Z79899 Other long term (current) drug therapy: Secondary | ICD-10-CM | POA: Diagnosis not present

## 2021-02-12 DIAGNOSIS — M3500 Sicca syndrome, unspecified: Secondary | ICD-10-CM | POA: Diagnosis not present

## 2021-02-12 DIAGNOSIS — G35 Multiple sclerosis: Secondary | ICD-10-CM | POA: Diagnosis not present

## 2021-02-12 DIAGNOSIS — M1611 Unilateral primary osteoarthritis, right hip: Secondary | ICD-10-CM | POA: Diagnosis not present

## 2021-02-12 DIAGNOSIS — Z01818 Encounter for other preprocedural examination: Secondary | ICD-10-CM | POA: Diagnosis not present

## 2021-02-12 DIAGNOSIS — Z880 Allergy status to penicillin: Secondary | ICD-10-CM | POA: Diagnosis not present

## 2021-02-14 DIAGNOSIS — E039 Hypothyroidism, unspecified: Secondary | ICD-10-CM | POA: Diagnosis not present

## 2021-02-14 DIAGNOSIS — M1610 Unilateral primary osteoarthritis, unspecified hip: Secondary | ICD-10-CM | POA: Diagnosis not present

## 2021-02-14 DIAGNOSIS — Z1231 Encounter for screening mammogram for malignant neoplasm of breast: Secondary | ICD-10-CM | POA: Diagnosis not present

## 2021-02-14 DIAGNOSIS — D508 Other iron deficiency anemias: Secondary | ICD-10-CM | POA: Diagnosis not present

## 2021-02-14 DIAGNOSIS — Z1211 Encounter for screening for malignant neoplasm of colon: Secondary | ICD-10-CM | POA: Diagnosis not present

## 2021-02-14 DIAGNOSIS — Z0000 Encounter for general adult medical examination without abnormal findings: Secondary | ICD-10-CM | POA: Diagnosis not present

## 2021-02-14 DIAGNOSIS — M069 Rheumatoid arthritis, unspecified: Secondary | ICD-10-CM | POA: Diagnosis not present

## 2021-02-18 DIAGNOSIS — Z20822 Contact with and (suspected) exposure to covid-19: Secondary | ICD-10-CM | POA: Diagnosis not present

## 2021-02-18 DIAGNOSIS — D649 Anemia, unspecified: Secondary | ICD-10-CM | POA: Diagnosis not present

## 2021-02-21 DIAGNOSIS — D508 Other iron deficiency anemias: Secondary | ICD-10-CM | POA: Diagnosis not present

## 2021-02-21 DIAGNOSIS — E039 Hypothyroidism, unspecified: Secondary | ICD-10-CM | POA: Diagnosis not present

## 2021-02-21 DIAGNOSIS — Z471 Aftercare following joint replacement surgery: Secondary | ICD-10-CM | POA: Diagnosis not present

## 2021-02-21 DIAGNOSIS — Z96641 Presence of right artificial hip joint: Secondary | ICD-10-CM | POA: Diagnosis not present

## 2021-02-21 DIAGNOSIS — M3500 Sicca syndrome, unspecified: Secondary | ICD-10-CM | POA: Diagnosis not present

## 2021-02-21 DIAGNOSIS — G35 Multiple sclerosis: Secondary | ICD-10-CM | POA: Diagnosis not present

## 2021-02-21 DIAGNOSIS — J4520 Mild intermittent asthma, uncomplicated: Secondary | ICD-10-CM | POA: Diagnosis not present

## 2021-02-21 DIAGNOSIS — M0800 Unspecified juvenile rheumatoid arthritis of unspecified site: Secondary | ICD-10-CM | POA: Diagnosis not present

## 2021-02-21 DIAGNOSIS — Z79899 Other long term (current) drug therapy: Secondary | ICD-10-CM | POA: Diagnosis not present

## 2021-02-21 DIAGNOSIS — Z886 Allergy status to analgesic agent status: Secondary | ICD-10-CM | POA: Diagnosis not present

## 2021-02-21 DIAGNOSIS — Z7951 Long term (current) use of inhaled steroids: Secondary | ICD-10-CM | POA: Diagnosis not present

## 2021-02-21 DIAGNOSIS — M1611 Unilateral primary osteoarthritis, right hip: Secondary | ICD-10-CM | POA: Diagnosis not present

## 2021-02-21 DIAGNOSIS — Z880 Allergy status to penicillin: Secondary | ICD-10-CM | POA: Diagnosis not present

## 2021-02-26 DIAGNOSIS — E039 Hypothyroidism, unspecified: Secondary | ICD-10-CM | POA: Diagnosis not present

## 2021-02-26 DIAGNOSIS — D508 Other iron deficiency anemias: Secondary | ICD-10-CM | POA: Diagnosis not present

## 2021-02-26 DIAGNOSIS — M069 Rheumatoid arthritis, unspecified: Secondary | ICD-10-CM | POA: Diagnosis not present

## 2021-02-26 DIAGNOSIS — M1610 Unilateral primary osteoarthritis, unspecified hip: Secondary | ICD-10-CM | POA: Diagnosis not present

## 2021-02-26 DIAGNOSIS — J4520 Mild intermittent asthma, uncomplicated: Secondary | ICD-10-CM | POA: Diagnosis not present

## 2021-02-27 DIAGNOSIS — D6859 Other primary thrombophilia: Secondary | ICD-10-CM | POA: Diagnosis not present

## 2021-02-27 DIAGNOSIS — M1610 Unilateral primary osteoarthritis, unspecified hip: Secondary | ICD-10-CM | POA: Diagnosis not present

## 2021-03-06 DIAGNOSIS — Z79899 Other long term (current) drug therapy: Secondary | ICD-10-CM | POA: Diagnosis not present

## 2021-03-06 DIAGNOSIS — G379 Demyelinating disease of central nervous system, unspecified: Secondary | ICD-10-CM | POA: Diagnosis not present

## 2021-03-27 DIAGNOSIS — D508 Other iron deficiency anemias: Secondary | ICD-10-CM | POA: Diagnosis not present

## 2021-03-27 DIAGNOSIS — D6859 Other primary thrombophilia: Secondary | ICD-10-CM | POA: Diagnosis not present

## 2021-03-27 DIAGNOSIS — Z96641 Presence of right artificial hip joint: Secondary | ICD-10-CM | POA: Diagnosis not present

## 2021-04-22 DIAGNOSIS — M069 Rheumatoid arthritis, unspecified: Secondary | ICD-10-CM | POA: Diagnosis not present

## 2021-04-22 DIAGNOSIS — D508 Other iron deficiency anemias: Secondary | ICD-10-CM | POA: Diagnosis not present

## 2021-04-22 DIAGNOSIS — J4520 Mild intermittent asthma, uncomplicated: Secondary | ICD-10-CM | POA: Diagnosis not present

## 2021-04-22 DIAGNOSIS — E039 Hypothyroidism, unspecified: Secondary | ICD-10-CM | POA: Diagnosis not present

## 2021-04-22 DIAGNOSIS — M1610 Unilateral primary osteoarthritis, unspecified hip: Secondary | ICD-10-CM | POA: Diagnosis not present

## 2021-05-20 DIAGNOSIS — M1612 Unilateral primary osteoarthritis, left hip: Secondary | ICD-10-CM | POA: Diagnosis not present

## 2021-05-20 DIAGNOSIS — Z96641 Presence of right artificial hip joint: Secondary | ICD-10-CM | POA: Diagnosis not present

## 2021-05-20 DIAGNOSIS — I878 Other specified disorders of veins: Secondary | ICD-10-CM | POA: Diagnosis not present

## 2021-07-19 DIAGNOSIS — M2578 Osteophyte, vertebrae: Secondary | ICD-10-CM | POA: Diagnosis not present

## 2021-07-19 DIAGNOSIS — M545 Low back pain, unspecified: Secondary | ICD-10-CM | POA: Diagnosis not present

## 2021-07-19 DIAGNOSIS — M48061 Spinal stenosis, lumbar region without neurogenic claudication: Secondary | ICD-10-CM | POA: Diagnosis not present

## 2021-07-19 DIAGNOSIS — Z96641 Presence of right artificial hip joint: Secondary | ICD-10-CM | POA: Diagnosis not present

## 2021-07-19 DIAGNOSIS — M4807 Spinal stenosis, lumbosacral region: Secondary | ICD-10-CM | POA: Diagnosis not present

## 2021-07-19 DIAGNOSIS — M25551 Pain in right hip: Secondary | ICD-10-CM | POA: Diagnosis not present

## 2021-07-19 DIAGNOSIS — M1611 Unilateral primary osteoarthritis, right hip: Secondary | ICD-10-CM | POA: Diagnosis not present

## 2021-07-28 DIAGNOSIS — R4020 Unspecified coma: Secondary | ICD-10-CM | POA: Diagnosis not present

## 2021-07-28 DIAGNOSIS — R42 Dizziness and giddiness: Secondary | ICD-10-CM | POA: Diagnosis not present

## 2021-07-28 DIAGNOSIS — R55 Syncope and collapse: Secondary | ICD-10-CM | POA: Diagnosis not present

## 2021-07-29 DIAGNOSIS — Z96641 Presence of right artificial hip joint: Secondary | ICD-10-CM | POA: Diagnosis not present

## 2021-07-29 DIAGNOSIS — M5416 Radiculopathy, lumbar region: Secondary | ICD-10-CM | POA: Diagnosis not present

## 2021-08-12 DIAGNOSIS — M069 Rheumatoid arthritis, unspecified: Secondary | ICD-10-CM | POA: Diagnosis not present

## 2021-08-12 DIAGNOSIS — E039 Hypothyroidism, unspecified: Secondary | ICD-10-CM | POA: Diagnosis not present

## 2021-08-12 DIAGNOSIS — Z96641 Presence of right artificial hip joint: Secondary | ICD-10-CM | POA: Diagnosis not present

## 2021-08-12 DIAGNOSIS — M3500 Sicca syndrome, unspecified: Secondary | ICD-10-CM | POA: Diagnosis not present

## 2021-08-12 DIAGNOSIS — Z23 Encounter for immunization: Secondary | ICD-10-CM | POA: Diagnosis not present

## 2021-08-12 DIAGNOSIS — J301 Allergic rhinitis due to pollen: Secondary | ICD-10-CM | POA: Diagnosis not present

## 2021-08-12 DIAGNOSIS — G379 Demyelinating disease of central nervous system, unspecified: Secondary | ICD-10-CM | POA: Diagnosis not present

## 2021-08-12 DIAGNOSIS — D508 Other iron deficiency anemias: Secondary | ICD-10-CM | POA: Diagnosis not present

## 2021-08-16 ENCOUNTER — Other Ambulatory Visit: Payer: Self-pay | Admitting: Internal Medicine

## 2021-08-16 ENCOUNTER — Ambulatory Visit
Admission: RE | Admit: 2021-08-16 | Discharge: 2021-08-16 | Disposition: A | Discharge status: Home / Self Care | Payer: Medicare Other | Source: Ambulatory Visit | Attending: Internal Medicine | Admitting: Internal Medicine

## 2021-08-16 DIAGNOSIS — S0990XA Unspecified injury of head, initial encounter: Secondary | ICD-10-CM

## 2021-08-16 DIAGNOSIS — R55 Syncope and collapse: Secondary | ICD-10-CM | POA: Diagnosis not present

## 2021-08-16 DIAGNOSIS — Z043 Encounter for examination and observation following other accident: Secondary | ICD-10-CM | POA: Diagnosis not present

## 2021-08-19 ENCOUNTER — Other Ambulatory Visit: Payer: Self-pay | Admitting: Internal Medicine

## 2021-08-19 DIAGNOSIS — Z1231 Encounter for screening mammogram for malignant neoplasm of breast: Secondary | ICD-10-CM

## 2021-08-20 DIAGNOSIS — R55 Syncope and collapse: Secondary | ICD-10-CM | POA: Diagnosis not present

## 2021-08-20 DIAGNOSIS — M79604 Pain in right leg: Secondary | ICD-10-CM | POA: Diagnosis not present

## 2021-08-20 DIAGNOSIS — M25551 Pain in right hip: Secondary | ICD-10-CM | POA: Diagnosis not present

## 2021-08-20 DIAGNOSIS — M5459 Other low back pain: Secondary | ICD-10-CM | POA: Diagnosis not present

## 2021-08-28 DIAGNOSIS — M25551 Pain in right hip: Secondary | ICD-10-CM | POA: Diagnosis not present

## 2021-08-28 DIAGNOSIS — M79604 Pain in right leg: Secondary | ICD-10-CM | POA: Diagnosis not present

## 2021-08-28 DIAGNOSIS — M5459 Other low back pain: Secondary | ICD-10-CM | POA: Diagnosis not present

## 2021-09-03 DIAGNOSIS — M25551 Pain in right hip: Secondary | ICD-10-CM | POA: Diagnosis not present

## 2021-09-03 DIAGNOSIS — M79604 Pain in right leg: Secondary | ICD-10-CM | POA: Diagnosis not present

## 2021-09-03 DIAGNOSIS — W19XXXD Unspecified fall, subsequent encounter: Secondary | ICD-10-CM | POA: Diagnosis not present

## 2021-09-03 DIAGNOSIS — G44209 Tension-type headache, unspecified, not intractable: Secondary | ICD-10-CM | POA: Diagnosis not present

## 2021-09-03 DIAGNOSIS — M5459 Other low back pain: Secondary | ICD-10-CM | POA: Diagnosis not present

## 2021-09-05 DIAGNOSIS — M5459 Other low back pain: Secondary | ICD-10-CM | POA: Diagnosis not present

## 2021-09-05 DIAGNOSIS — M25551 Pain in right hip: Secondary | ICD-10-CM | POA: Diagnosis not present

## 2021-09-05 DIAGNOSIS — M79604 Pain in right leg: Secondary | ICD-10-CM | POA: Diagnosis not present

## 2021-09-09 DIAGNOSIS — R55 Syncope and collapse: Secondary | ICD-10-CM | POA: Diagnosis not present

## 2021-09-10 ENCOUNTER — Telehealth: Payer: Self-pay

## 2021-09-10 DIAGNOSIS — M25551 Pain in right hip: Secondary | ICD-10-CM | POA: Diagnosis not present

## 2021-09-10 DIAGNOSIS — M5459 Other low back pain: Secondary | ICD-10-CM | POA: Diagnosis not present

## 2021-09-10 DIAGNOSIS — M79604 Pain in right leg: Secondary | ICD-10-CM | POA: Diagnosis not present

## 2021-09-10 NOTE — Telephone Encounter (Signed)
ZIO PATCH MONITOR SCANNED TO REFERRAL

## 2021-09-11 DIAGNOSIS — R55 Syncope and collapse: Secondary | ICD-10-CM | POA: Diagnosis not present

## 2021-09-13 DIAGNOSIS — M79604 Pain in right leg: Secondary | ICD-10-CM | POA: Diagnosis not present

## 2021-09-13 DIAGNOSIS — M5459 Other low back pain: Secondary | ICD-10-CM | POA: Diagnosis not present

## 2021-09-13 DIAGNOSIS — M25551 Pain in right hip: Secondary | ICD-10-CM | POA: Diagnosis not present

## 2021-09-16 DIAGNOSIS — R519 Headache, unspecified: Secondary | ICD-10-CM | POA: Diagnosis not present

## 2021-09-16 DIAGNOSIS — W19XXXS Unspecified fall, sequela: Secondary | ICD-10-CM | POA: Diagnosis not present

## 2021-09-16 DIAGNOSIS — L209 Atopic dermatitis, unspecified: Secondary | ICD-10-CM | POA: Diagnosis not present

## 2021-09-16 DIAGNOSIS — R220 Localized swelling, mass and lump, head: Secondary | ICD-10-CM | POA: Diagnosis not present

## 2021-09-17 DIAGNOSIS — M25551 Pain in right hip: Secondary | ICD-10-CM | POA: Diagnosis not present

## 2021-09-17 DIAGNOSIS — M79604 Pain in right leg: Secondary | ICD-10-CM | POA: Diagnosis not present

## 2021-09-17 DIAGNOSIS — M5459 Other low back pain: Secondary | ICD-10-CM | POA: Diagnosis not present

## 2021-09-18 ENCOUNTER — Ambulatory Visit: Payer: Medicare Other | Admitting: Internal Medicine

## 2021-09-19 ENCOUNTER — Ambulatory Visit
Admission: RE | Admit: 2021-09-19 | Discharge: 2021-09-19 | Disposition: A | Discharge status: Home / Self Care | Payer: Medicare Other | Source: Ambulatory Visit | Attending: Internal Medicine | Admitting: Internal Medicine

## 2021-09-19 DIAGNOSIS — M5459 Other low back pain: Secondary | ICD-10-CM | POA: Diagnosis not present

## 2021-09-19 DIAGNOSIS — M79604 Pain in right leg: Secondary | ICD-10-CM | POA: Diagnosis not present

## 2021-09-19 DIAGNOSIS — M25551 Pain in right hip: Secondary | ICD-10-CM | POA: Diagnosis not present

## 2021-09-19 DIAGNOSIS — Z1231 Encounter for screening mammogram for malignant neoplasm of breast: Secondary | ICD-10-CM | POA: Diagnosis not present

## 2021-09-24 DIAGNOSIS — G379 Demyelinating disease of central nervous system, unspecified: Secondary | ICD-10-CM | POA: Diagnosis not present

## 2021-09-25 DIAGNOSIS — M5459 Other low back pain: Secondary | ICD-10-CM | POA: Diagnosis not present

## 2021-09-25 DIAGNOSIS — M79604 Pain in right leg: Secondary | ICD-10-CM | POA: Diagnosis not present

## 2021-09-25 DIAGNOSIS — M25551 Pain in right hip: Secondary | ICD-10-CM | POA: Diagnosis not present

## 2021-09-27 DIAGNOSIS — M79604 Pain in right leg: Secondary | ICD-10-CM | POA: Diagnosis not present

## 2021-09-27 DIAGNOSIS — M25551 Pain in right hip: Secondary | ICD-10-CM | POA: Diagnosis not present

## 2021-09-27 DIAGNOSIS — M5459 Other low back pain: Secondary | ICD-10-CM | POA: Diagnosis not present

## 2021-10-02 DIAGNOSIS — M5459 Other low back pain: Secondary | ICD-10-CM | POA: Diagnosis not present

## 2021-10-02 DIAGNOSIS — M25551 Pain in right hip: Secondary | ICD-10-CM | POA: Diagnosis not present

## 2021-10-02 DIAGNOSIS — M79604 Pain in right leg: Secondary | ICD-10-CM | POA: Diagnosis not present

## 2021-10-04 DIAGNOSIS — G9389 Other specified disorders of brain: Secondary | ICD-10-CM | POA: Diagnosis not present

## 2021-10-04 DIAGNOSIS — R4020 Unspecified coma: Secondary | ICD-10-CM | POA: Diagnosis not present

## 2021-10-08 DIAGNOSIS — L209 Atopic dermatitis, unspecified: Secondary | ICD-10-CM | POA: Diagnosis not present

## 2021-10-08 DIAGNOSIS — R55 Syncope and collapse: Secondary | ICD-10-CM | POA: Diagnosis not present

## 2021-10-08 DIAGNOSIS — W19XXXS Unspecified fall, sequela: Secondary | ICD-10-CM | POA: Diagnosis not present

## 2021-10-08 DIAGNOSIS — G9608 Other cranial cerebrospinal fluid leak: Secondary | ICD-10-CM | POA: Diagnosis not present

## 2021-10-09 DIAGNOSIS — M79604 Pain in right leg: Secondary | ICD-10-CM | POA: Diagnosis not present

## 2021-10-09 DIAGNOSIS — M5459 Other low back pain: Secondary | ICD-10-CM | POA: Diagnosis not present

## 2021-10-09 DIAGNOSIS — M25551 Pain in right hip: Secondary | ICD-10-CM | POA: Diagnosis not present

## 2021-10-11 DIAGNOSIS — M5459 Other low back pain: Secondary | ICD-10-CM | POA: Diagnosis not present

## 2021-10-11 DIAGNOSIS — M79604 Pain in right leg: Secondary | ICD-10-CM | POA: Diagnosis not present

## 2021-10-11 DIAGNOSIS — M25551 Pain in right hip: Secondary | ICD-10-CM | POA: Diagnosis not present

## 2021-10-14 DIAGNOSIS — D649 Anemia, unspecified: Secondary | ICD-10-CM | POA: Diagnosis not present

## 2021-10-15 DIAGNOSIS — M5459 Other low back pain: Secondary | ICD-10-CM | POA: Diagnosis not present

## 2021-10-15 DIAGNOSIS — M25551 Pain in right hip: Secondary | ICD-10-CM | POA: Diagnosis not present

## 2021-10-15 DIAGNOSIS — M79604 Pain in right leg: Secondary | ICD-10-CM | POA: Diagnosis not present

## 2021-10-17 DIAGNOSIS — M5459 Other low back pain: Secondary | ICD-10-CM | POA: Diagnosis not present

## 2021-10-17 DIAGNOSIS — M79604 Pain in right leg: Secondary | ICD-10-CM | POA: Diagnosis not present

## 2021-10-17 DIAGNOSIS — M25551 Pain in right hip: Secondary | ICD-10-CM | POA: Diagnosis not present

## 2021-10-21 DIAGNOSIS — M79604 Pain in right leg: Secondary | ICD-10-CM | POA: Diagnosis not present

## 2021-10-21 DIAGNOSIS — M5459 Other low back pain: Secondary | ICD-10-CM | POA: Diagnosis not present

## 2021-10-21 DIAGNOSIS — M25551 Pain in right hip: Secondary | ICD-10-CM | POA: Diagnosis not present

## 2021-10-23 DIAGNOSIS — M79604 Pain in right leg: Secondary | ICD-10-CM | POA: Diagnosis not present

## 2021-10-23 DIAGNOSIS — M5459 Other low back pain: Secondary | ICD-10-CM | POA: Diagnosis not present

## 2021-10-23 DIAGNOSIS — M25551 Pain in right hip: Secondary | ICD-10-CM | POA: Diagnosis not present

## 2021-10-24 DIAGNOSIS — L280 Lichen simplex chronicus: Secondary | ICD-10-CM | POA: Diagnosis not present

## 2021-10-24 DIAGNOSIS — L810 Postinflammatory hyperpigmentation: Secondary | ICD-10-CM | POA: Diagnosis not present

## 2021-10-24 DIAGNOSIS — R202 Paresthesia of skin: Secondary | ICD-10-CM | POA: Diagnosis not present

## 2021-10-28 DIAGNOSIS — G9389 Other specified disorders of brain: Secondary | ICD-10-CM | POA: Diagnosis not present

## 2021-10-28 DIAGNOSIS — I6200 Nontraumatic subdural hemorrhage, unspecified: Secondary | ICD-10-CM | POA: Diagnosis not present

## 2021-10-29 ENCOUNTER — Ambulatory Visit (INDEPENDENT_AMBULATORY_CARE_PROVIDER_SITE_OTHER): Payer: Medicare Other | Admitting: Diagnostic Neuroimaging

## 2021-10-29 DIAGNOSIS — R4689 Other symptoms and signs involving appearance and behavior: Secondary | ICD-10-CM

## 2021-10-29 DIAGNOSIS — R55 Syncope and collapse: Secondary | ICD-10-CM | POA: Diagnosis not present

## 2021-10-30 DIAGNOSIS — M25551 Pain in right hip: Secondary | ICD-10-CM | POA: Diagnosis not present

## 2021-10-30 DIAGNOSIS — M5459 Other low back pain: Secondary | ICD-10-CM | POA: Diagnosis not present

## 2021-10-30 DIAGNOSIS — M79604 Pain in right leg: Secondary | ICD-10-CM | POA: Diagnosis not present

## 2021-11-05 DIAGNOSIS — M3500 Sicca syndrome, unspecified: Secondary | ICD-10-CM | POA: Diagnosis not present

## 2021-11-11 DIAGNOSIS — M79604 Pain in right leg: Secondary | ICD-10-CM | POA: Diagnosis not present

## 2021-11-11 DIAGNOSIS — M25551 Pain in right hip: Secondary | ICD-10-CM | POA: Diagnosis not present

## 2021-11-11 DIAGNOSIS — M5459 Other low back pain: Secondary | ICD-10-CM | POA: Diagnosis not present

## 2021-11-11 NOTE — Procedures (Signed)
[facility]  EEG (ELECTROENCEPHALOGRAM) REPORT   STUDY DATE: 10/29/21 PATIENT NAME: Sara Hayes DOB: [birth year] MRN: [id number]  ORDERING CLINICIAN: Joycelyn Schmid, MD   TECHNOLOGIST: Marcheta Grammes TECHNIQUE: Electroencephalogram was recorded utilizing standard 10-20 system of lead placement and reformatted into average and bipolar montages.  RECORDING TIME: 23 minutes  ACTIVATION: hyperventilation and photic stimulation  CLINICAL INFORMATION: 51 year old female with abnormal spells.  FINDINGS: Posterior dominant background rhythms, which attenuate with eye opening, ranging 9-10 hertz and 15-20 microvolts. No focal, lateralizing, epileptiform activity or seizures are seen. Patient recorded in the awake and drowsy state. EKG channel shows regular rhythm of 70-75 beats per minute.   IMPRESSION:   Normal EEG in the awake and drowsy states.     INTERPRETING PHYSICIAN:  Suanne Marker, MD Certified in Neurology, Neurophysiology and Neuroimaging  [facility] [address] [place], [place] [address] [phone]

## 2021-11-20 DIAGNOSIS — M79604 Pain in right leg: Secondary | ICD-10-CM | POA: Diagnosis not present

## 2021-11-20 DIAGNOSIS — M25551 Pain in right hip: Secondary | ICD-10-CM | POA: Diagnosis not present

## 2021-11-20 DIAGNOSIS — M5459 Other low back pain: Secondary | ICD-10-CM | POA: Diagnosis not present

## 2021-11-25 ENCOUNTER — Ambulatory Visit: Payer: Medicare Other | Admitting: Internal Medicine

## 2021-11-27 DIAGNOSIS — M5459 Other low back pain: Secondary | ICD-10-CM | POA: Diagnosis not present

## 2021-11-27 DIAGNOSIS — M79604 Pain in right leg: Secondary | ICD-10-CM | POA: Diagnosis not present

## 2021-11-27 DIAGNOSIS — M25551 Pain in right hip: Secondary | ICD-10-CM | POA: Diagnosis not present

## 2021-12-05 DIAGNOSIS — L280 Lichen simplex chronicus: Secondary | ICD-10-CM | POA: Diagnosis not present

## 2021-12-11 DIAGNOSIS — M25551 Pain in right hip: Secondary | ICD-10-CM | POA: Diagnosis not present

## 2021-12-11 DIAGNOSIS — M79604 Pain in right leg: Secondary | ICD-10-CM | POA: Diagnosis not present

## 2021-12-11 DIAGNOSIS — M5459 Other low back pain: Secondary | ICD-10-CM | POA: Diagnosis not present

## 2021-12-16 DIAGNOSIS — M5459 Other low back pain: Secondary | ICD-10-CM | POA: Diagnosis not present

## 2021-12-16 DIAGNOSIS — M25551 Pain in right hip: Secondary | ICD-10-CM | POA: Diagnosis not present

## 2021-12-16 DIAGNOSIS — M79604 Pain in right leg: Secondary | ICD-10-CM | POA: Diagnosis not present

## 2021-12-25 DIAGNOSIS — M25551 Pain in right hip: Secondary | ICD-10-CM | POA: Diagnosis not present

## 2021-12-25 DIAGNOSIS — M79604 Pain in right leg: Secondary | ICD-10-CM | POA: Diagnosis not present

## 2021-12-25 DIAGNOSIS — M5459 Other low back pain: Secondary | ICD-10-CM | POA: Diagnosis not present

## 2022-01-01 DIAGNOSIS — M5459 Other low back pain: Secondary | ICD-10-CM | POA: Diagnosis not present

## 2022-01-01 DIAGNOSIS — M25551 Pain in right hip: Secondary | ICD-10-CM | POA: Diagnosis not present

## 2022-01-01 DIAGNOSIS — M79604 Pain in right leg: Secondary | ICD-10-CM | POA: Diagnosis not present

## 2022-01-07 NOTE — Progress Notes (Signed)
? ?NEW PATIENT ?Date of Service/Encounter:  01/08/22 ?Referring provider: Leeroy Cha,* ?Primary care provider: Leeroy Cha, MD ? ?Subjective:  ?Sara Hayes is a 51 y.o. female with a PMHx of JIA/RA's, Sjogren's, CNS/PNS demyelinating syndrome consistent with multiple sclerosis, chronic bilateral subdural hematomas, lichen simplex chronicus followed by dermatology presenting today for evaluation of atopic dermatitis. ?History obtained from: chart review and patient. ?  ?Moved to [place] in 2012 from [place].  Allergies have gotten bad in the past 4 years. ? ?Chronic rhinitis-takes xyzal or claritin and flonase every morning, singulair every night ?Spring and summer are worst seasons ?Her eyes burn, sometimes swell, runny nose and congestion and drainage with nasally voice ?She breaks out randomly in hives, from pollen. ?She uses a cream to help improve her symptoms.   ?She usually rides around with windows up to keep from getting exposed to pollens during this time of year. ?Uses restasis for dry eyes.  Follows with ophthalmologist.  Last appointment was 2022 in the Spring.   ? ?Asthma History:  ?-Had issues growing up, but official diagnosed as an adult. ?-Current symptoms include shortness of breath and occasional wheezing ?Using rescue inhaler 1 time per week ?-Limitations to daily activity: mild ?- 0 ED visits, 0 UC visits and 0 oral steroids in the past year ?- 0 number of lifetime hospitalizations, 0 number of lifetime intubations.  ?- Identified Triggers:  pollen and exercise ?- History of prior pneumonias: 0 ?- History of prior COVID-19 infection: 0 ?- Smoking exposure: none ?Previous Diagnostics:  ?- Prior PFTs or spirometry: 2015-normal pulmonary function test with ratio of 90% and FEV1 of 101% ?- Most Recent AEC (10/14/21): 200 ?-Most Recent Chest Imaging: None available for review ? ?Management:  ?- Current regimen:  ?- Maintenance: Flovent 220 ?- Rescue: Albuterol 2 puffs q4-6  hrs PRN ? ?History of reaction to penicillins/amoxicillin -broke out into hives, when she was younger ? ?Aspirin-had Rheumatic fever and was told to avoid. ? ?Other allergy screening: ?Food allergy: no ?Hymenoptera allergy:  yellow jacket-local reaction , felt light headed almost to the point of passing out, had to go to the emergency room.  Does not carry an EpiPen. ?Eczema:no ? ? ?Past Medical History: ?Past Medical History:  ?Diagnosis Date  ? Arthritis   ? juvenile age 18  ? Asthma   ? Biliary colic   ? Chronic pain syndrome   ? Constipation   ? Delayed menses   ? Demyelinating disease of central nervous system, unspecified ([place])   ? Gallstone   ? History of total right hip replacement   ? Hypothyroidism   ? Idiopathic peripheral neuropathy   ? Iron deficiency anemia due to dietary causes   ? MS (multiple sclerosis) ([place])   ? december 2018 ; managed by [facility] in [place]   ? Myoclonus   ? Neuropathy   ? Ovarian cyst   ? Pituitary tumor   ? Primary osteoarthritis of hip, unspecified laterality   ? Rheumatic fever   ? age 16  ? Rheumatoid arthritis ([place])   ? Seasonal allergic rhinitis due to pollen   ? Sickle cell trait ([place])   ? Sjogren's disease ([place])   ? Spotting   ? Thrombophilia ([place])   ? Thyroid disease   ? 11/2016 no longer taking med  ? Urinary incontinence   ? ?Medication List:  ?Current Outpatient Medications  ?Medication Sig Dispense Refill  ? Acetaminophen (TYLENOL ARTHRITIS PAIN PO) Take by mouth.    ?  AUBAGIO 14 MG TABS Take 1 tablet by mouth daily.    ? azelastine (ASTELIN) 0.1 % nasal spray Place 2 sprays into both nostrils 2 (two) times daily as needed for rhinitis. Use in each nostril as directed 30 mL 12  ? baclofen (LIORESAL) 10 MG tablet Take 10 mg by mouth 3 (three) times daily as needed for muscle spasms. 2-3 x daily    ? calcium carbonate (TUMS - DOSED IN MG ELEMENTAL CALCIUM) 500 MG chewable tablet Chew 1 tablet by mouth daily.    ? Cholecalciferol (VITAMIN D) 50 MCG  (2000 UT) CAPS Take by mouth.    ? clonazePAM (KLONOPIN) 0.5 MG tablet Take 0.5 mg by mouth 3 (three) times daily.     ? cycloSPORINE (RESTASIS) 0.05 % ophthalmic emulsion Place 1 drop into both eyes 2 (two) times daily.    ? ferrous sulfate 325 (65 FE) MG EC tablet Take 325 mg by mouth 3 (three) times daily with meals.    ? fluticasone (FLONASE) 50 MCG/ACT nasal spray Place into both nostrils daily.    ? fluticasone (FLOVENT HFA) 220 MCG/ACT inhaler Inhale 1 puff into the lungs 2 (two) times daily.     ? gabapentin (NEURONTIN) 300 MG capsule Take 900-1,200 mg by mouth 3 (three) times daily. 900 mg in am and midday, and 1200 mg at night    ? hydrocortisone 2.5 % ointment Apply topically twice daily as need to red sandpapery rash. 30 g 0  ? hydroxychloroquine (PLAQUENIL) 200 MG tablet Take 400 mg by mouth daily.     ? levocetirizine (XYZAL) 5 MG tablet Take 5 mg by mouth every evening.    ? levonorgestrel (MIRENA) 20 MCG/DAY IUD 1 each by Intrauterine route once.    ? levonorgestrel (MIRENA, 52 MG,) 20 MCG/DAY IUD Place 1 application. vaginally See admin instructions.    ? methocarbamol (ROBAXIN) 500 MG tablet Take 500 mg by mouth 4 (four) times daily.    ? montelukast (SINGULAIR) 10 MG tablet Take 10 mg by mouth at bedtime.    ? Multiple Vitamins-Minerals (MULTIVITAMIN GUMMIES ADULT PO) Take by mouth.    ? nortriptyline (PAMELOR) 75 MG capsule Take 75 mg by mouth at bedtime.     ? Olopatadine HCl 0.2 % SOLN Apply 1 drop to eye daily as needed. 2.5 mL 5  ? pilocarpine (SALAGEN) 5 MG tablet Take 5 mg by mouth 3 (three) times daily as needed (dry mouth).     ? PROAIR HFA 108 (90 Base) MCG/ACT inhaler Inhale 2 puffs into the lungs every 4 (four) hours as needed for wheezing or shortness of breath.     ? vitamin C (ASCORBIC ACID) 500 MG tablet Take 500 mg by mouth daily.    ? zinc sulfate 220 (50 Zn) MG capsule Take 220 mg by mouth daily.    ? ?No current facility-administered medications for this visit.  ? ?Known  Allergies:  ?Allergies  ?Allergen Reactions  ? Amoxicillin Hives  ? Penicillins Hives  ?  Has patient had a PCN reaction causing immediate rash, facial/tongue/throat swelling, SOB or lightheadedness with hypotension: YES ?Has patient had a PCN reaction causing severe rash involving mucus membranes or skin necrosis: yes ?Has patient had a PCN reaction that required hospitalization: no ?Has patient had a PCN reaction occurring within the last 10 years: no ?If all of the above answers are "NO", then may proceed with Cephalosporin use. ?  ? Aspirin Hives  ? Latex Hives and Other (See  Comments)  ?  Reaction Type: Allergy; Severity: Moderate; Reaction(s): reddened skin ?  ? Naproxen Other (See Comments)  ?  Unknown, large blood clots  ? Niacin And Related Other (See Comments)  ?  flushing  ? ?Past Surgical History: ?Past Surgical History:  ?Procedure Laterality Date  ? CHOLECYSTECTOMY N/A 06/11/2018  ? Procedure: LAPAROSCOPIC CHOLECYSTECTOMY;  Surgeon: Kinsinger, Arta Bruce, MD;  Location: [facility];  Service: General;  Laterality: N/A;  ? TUBAL LIGATION    ? ?Family History: ?Family History  ?Problem Relation Age of Onset  ? Breast cancer Paternal Grandmother 28  ? ?Social History: Gidget lives in a house built in [place], no water damage, wood floors, gas heating, central AC, pet dog, no cockroaches, using dust mite protection on her bedding and pillows, no smoke exposure.  She is disabled.  + HEPA filter in the home.  ? ?ROS:  ?All other systems negative except as noted per HPI. ? ?Objective:  ?Blood pressure 110/68, pulse 76, temperature 97.8 ?F (36.6 ?C), resp. rate 16, height 5' 5.75" (1.67 m), weight 194 lb 9.6 oz (88.3 kg), SpO2 98 %. ?Body mass index is 31.65 kg/m?Marland Kitchen ?Physical Exam: ? ?General Appearance:  Alert, cooperative, no distress, appears stated age  ?Head:  Normocephalic, without obvious abnormality, atraumatic  ?Eyes:  Conjunctiva clear, EOM's intact  ?Nose: Nares normal, hypertrophic turbinates, normal mucosa,  and no visible anterior polyps  ?Throat: Lips, tongue normal; teeth and gums normal, normal posterior oropharynx  ?Neck: Supple, symmetrical  ?Lungs:   clear to auscultation bilaterally, Respirations unlabored,

## 2022-01-08 ENCOUNTER — Ambulatory Visit (INDEPENDENT_AMBULATORY_CARE_PROVIDER_SITE_OTHER): Payer: Medicare Other | Admitting: Internal Medicine

## 2022-01-08 ENCOUNTER — Encounter: Payer: Self-pay | Admitting: Internal Medicine

## 2022-01-08 VITALS — BP 110/68 | HR 76 | Temp 97.8°F | Resp 16 | Ht 65.75 in | Wt 194.6 lb

## 2022-01-08 DIAGNOSIS — J310 Chronic rhinitis: Secondary | ICD-10-CM | POA: Diagnosis not present

## 2022-01-08 DIAGNOSIS — Z91038 Other insect allergy status: Secondary | ICD-10-CM | POA: Diagnosis not present

## 2022-01-08 DIAGNOSIS — J4540 Moderate persistent asthma, uncomplicated: Secondary | ICD-10-CM | POA: Diagnosis not present

## 2022-01-08 DIAGNOSIS — H1013 Acute atopic conjunctivitis, bilateral: Secondary | ICD-10-CM | POA: Diagnosis not present

## 2022-01-08 DIAGNOSIS — Z880 Allergy status to penicillin: Secondary | ICD-10-CM

## 2022-01-08 DIAGNOSIS — J302 Other seasonal allergic rhinitis: Secondary | ICD-10-CM

## 2022-01-08 MED ORDER — OLOPATADINE HCL 0.2 % OP SOLN: 1.0000 [drp] | Freq: Every day | OPHTHALMIC | 5 refills | Status: DC | PRN

## 2022-01-08 MED ORDER — HYDROCORTISONE 2.5 % EX OINT: TOPICAL_OINTMENT | CUTANEOUS | 0 refills | Status: DC

## 2022-01-08 MED ORDER — AZELASTINE HCL 0.1 % NA SOLN: 2.0000 | Freq: Two times a day (BID) | NASAL | 12 refills | Status: DC | PRN

## 2022-01-08 NOTE — Patient Instructions (Addendum)
Chronic Rhinitis-perennial and seasonal allergic: ?- allergy testing today was positive to cats, intradermals positive to [place] grass, mold mix 1, 2, 3 and 4, cockroach, mite mix ?- allergen avoidance as below ?- consider allergy shots as long term control of your symptoms by teaching your immune system to be more tolerant of your allergy triggers ?- Continue Nasal Steroid Spray: Options include Flonase (fluticasone), Nasocort (triamcinolone), Nasonex (mometasome) 1- 2 sprays in each nostril daily (can buy over-the-counter if not covered by insurance)  Best results if used daily. ?- Start Astelin (Azelastine) 1-2 sprays in each nostril twice a day as needed.  You may use this as needed for nasal congestion/itchy ears/itchy nose if desired ?- Continue Singulair (Montelukast) '10mg'$  nightly. ?- Continue over the counter antihistamine daily or daily as needed.  Can increase to twice daily on days when hives occur. ?-Your options include Zyrtec (Cetirizine) '10mg'$ , Claritin (Loratadine) '10mg'$ , Allegra (Fexofenadine) '180mg'$ , or Xyzal (Levocetirinze) '5mg'$  ? ?Allergic Conjunctivitis:  ?- Consider Allergy Eye drops: great options include Pataday (Olopatadine) or Zaditor (ketotifen) for eye symptoms daily as needed-both sold over the counter if not covered by insurance.   ?-Avoid eye drops that say red eye relief as they may contain medications that dry out your eyes. ? ?Moderate Persistent Asthma: ?- your lung testing today looked great ?- Controller Inhaler: Start Flovent 220, 2 puffs twice a day; This Should Be Used Everyday ?- Rinse mouth out after use ?- Rescue Inhaler: Albuterol (Proair/Ventolin) 2 puffs . Use  every 4-6 hours as needed for chest tightness, wheezing, or coughing.  Can also use 15 minutes prior to exercise if you have symptoms with activity. ?- Asthma is not controlled if: ? - Symptoms are occurring >2 times a week OR ? - >2 times a month nighttime awakenings ? - You are requiring systemic steroids  (prednisone/steroid injections) more than once per year ? - Your require hospitalization for your asthma. ? - Please call the clinic to schedule a follow up if these symptoms arise ? ?Concern For Stinging Insect Allergy: ?- based on today's history, will obtain labs for stinging insects ?- if negative, would consider confirming with skin testing ?- please carry Epinephrine autoinjector at all times in case of accidental sting, to be used if stung and has SKIN AND ANY OTHER SYMPTOMS ?- for SKIN only, can take Benadryl 2 tsp (25 mg)  ?- recommend medical alert bracelet ?- practice avoidance measures as outlined below when possible ? ? ?H/O Penicillin allergy: ?- please schedule follow-up appt at your convenience for penicillin testing followed by graded oral challenge if indicated ?- please refrain from taking any antihistamines at least 3 days prior to this appointment  ?- around 80% of individuals outgrow this allergy in ~ 10 years and carrying it as a diagnosis can prevent you from getting proper therapy if needed ? ?Follow-up in 3 months, sooner if needed. ?It was a pleasure meeting you in clinic today. ? ?Control of Dog or Cat Allergen ? ?Avoidance is the best way to manage a dog or cat allergy. If you have a dog or cat and are allergic to dog or cats, consider removing the dog or cat from the home. ?If you have a dog or cat but don?t want to find it a new home, or if your family wants a pet even though someone in the household is allergic, here are some strategies that may help keep symptoms at bay: ? ?Keep the pet out of your bedroom and  restrict it to only a few rooms. Be advised that keeping the dog or cat in only one room will not limit the allergens to that room. ?Don?t pet, hug or kiss the dog or cat; if you do, wash your hands with soap and water. ?High-efficiency particulate air (HEPA) cleaners run continuously in a bedroom or living room can reduce allergen levels over time. ?Regular use of a  high-efficiency vacuum cleaner or a central vacuum can reduce allergen levels. ?Giving your dog or cat a bath at least once a week can reduce airborne allergen. ? ?Control of Mold Allergen  ? ?Mold and fungi can grow on a variety of surfaces provided certain temperature and moisture conditions exist.  Outdoor molds grow on plants, decaying vegetation and soil.  The major outdoor mold, Alternaria and Cladosporium, are found in very high numbers during hot and dry conditions.  Generally, a late Summer - Fall peak is seen for common outdoor fungal spores.  Rain will temporarily lower outdoor mold spore count, but counts rise rapidly when the rainy period ends.  The most important indoor molds are Aspergillus and Penicillium.  Dark, humid and poorly ventilated basements are ideal sites for mold growth.  The next most common sites of mold growth are the bathroom and the kitchen. ? ?Outdoor (Seasonal) Mold Control ? ?Use air conditioning and keep windows closed ?Avoid exposure to decaying vegetation. ?Avoid leaf raking. ?Avoid grain handling. ?Consider wearing a face mask if working in moldy areas.  ? ? ?Indoor (Perennial) Mold Control  ? ?Maintain humidity below 50%. ?Clean washable surfaces with 5% bleach solution. ?Remove sources e.g. contaminated carpets. ? ? ? ?Control of Cockroach Allergen ? ?Cockroach allergen has been identified as an important cause of acute attacks of asthma, especially in urban settings.  There are fifty-five species of cockroach that exist in the [place], however only three, the [place], [place] species produce allergen that can affect patients with Asthma.  Allergens can be obtained from fecal particles, egg casings and secretions from cockroaches. ?   ?Remove food sources. ?Reduce access to water. ?Seal access and entry points. ?Spray runways with 0.5-1% Diazinon or Chlorpyrifos ?Blow boric acid power under stoves and refrigerator. ?Place bait stations (hydramethylnon) at  feeding sites. ? ?DUST MITE AVOIDANCE MEASURES: ? ?There are three main measures that need and can be taken to avoid house dust mites: ? ?Reduce accumulation of dust in general ?-reduce furniture, clothing, carpeting, books, stuffed animals, especially in bedroom ? ?Separate yourself from the dust ?-use pillow and mattress encasements (can be found at stores such as [employer] or online) ?-avoid direct exposure to air condition flow ?-use a HEPA filter device, especially in the bedroom; you can also use a HEPA filter vacuum cleaner ?-wipe dust with a moist towel instead of a dry towel or broom when cleaning ? ?Decrease mites and/or their secretions ?-wash clothing and linen and stuffed animals at highest temperature possible, at least every 2 weeks ?-stuffed animals can also be placed in a bag and put in a freezer overnight ? ?Despite the above measures, it is impossible to eliminate dust mites or their allergen completely from your home.  With the above measures the burden of mites in your home can be diminished, with the goal of minimizing your allergic symptoms.  Success will be reached only when implementing and using all means together. ? ?Reducing Pollen Exposure ? ?The American Academy of Allergy, Asthma and Immunology suggests the following steps  to reduce your exposure to pollen during allergy seasons. ?   ?Do not hang sheets or clothing out to dry; pollen may collect on these items. ?Do not mow lawns or spend time around freshly cut grass; mowing stirs up pollen. ?Keep windows closed at night.  Keep car windows closed while driving. ?Minimize morning activities outdoors, a time when pollen counts are usually at their highest. ?Stay indoors as much as possible when pollen counts or humidity is high and on windy days when pollen tends to remain in the air longer. ?Use air conditioning when possible.  Many air conditioners have filters that trap the pollen spores. ?Use a HEPA room air filter to  remove pollen form the indoor air you breathe. ? ?

## 2022-01-11 LAB — ALLERGEN HYMENOPTERA PANEL
Bumblebee: <0.1 kU/L
Honeybee IgE: <0.1 kU/L
Hornet, White Face, IgE: <0.1 kU/L
Hornet, Yellow, IgE: <0.1 kU/L
Paper Wasp IgE: <0.1 kU/L
Yellow Jacket, IgE: 0.22 kU/L — AB

## 2022-01-11 LAB — TRYPTASE: Tryptase: 3.5 ug/L (ref 2.2–13.2)

## 2022-01-21 ENCOUNTER — Other Ambulatory Visit: Payer: Self-pay | Admitting: Internal Medicine

## 2022-01-21 DIAGNOSIS — J3089 Other allergic rhinitis: Secondary | ICD-10-CM | POA: Diagnosis not present

## 2022-01-21 DIAGNOSIS — J302 Other seasonal allergic rhinitis: Secondary | ICD-10-CM

## 2022-01-21 NOTE — Progress Notes (Signed)
VIALS EXP 01-22-23 ?

## 2022-01-21 NOTE — Progress Notes (Signed)
Aeroallergen Immunotherapy  ? ?Ordering Provider: Dr. Sigurd Sos  ? ?Patient Details  ?Name: Sara Hayes  ?MRN: [id number]  ?Date of Birth: [birth year]  ? ?Order 1 of 2  ? ?Vial Label: G-DM-Cat  ? ?0.3 ml (Volume)  BAU Concentration -- [place] 10,000  ?0.5 ml (Volume)  1:10 Concentration -- Cat Hair  ?0.5 ml (Volume)   AU Concentration -- Mite Mix (DF 5,000 & DP 5,000)  ? ? ?1.3  ml Extract Subtotal  ?3.7  ml Diluent  ?5.0  ml Maintenance Total  ? ?Schedule:  B  ?Blue Vial (1:100,000): Schedule B (6 doses)  ?Yellow Vial (1:10,000): Schedule B (6 doses)  ?Green Vial (1:1,000): Schedule B (6 doses)  ?Red Vial (1:100): Schedule A (10 doses)  ? ?Special Instructions: B schedule. ?

## 2022-01-21 NOTE — Progress Notes (Signed)
Aeroallergen Immunotherapy  ? ?Ordering Provider: Dr. Sigurd Sos  ? ?Patient Details  ?Name: Sara Hayes  ?MRN: [id number]  ?Date of Birth: 02-09-1971  ? ?Order 2 of 2  ? ?Vial Label: CR-Molds  ? ?0.2 ml (Volume)  1:20 Concentration -- Alternaria alternata  ?0.2 ml (Volume)  1:20 Concentration -- Cladosporium herbarum  ?0.2 ml (Volume)  1:10 Concentration -- Aspergillus mix  ?0.2 ml (Volume)  1:10 Concentration -- Penicillium mix  ?0.2 ml (Volume)  1:10 Concentration -- Fusarium moniliforme  ?0.2 ml (Volume)  1:40 Concentration -- Aureobasidium pullulans  ?0.2 ml (Volume)  1:10 Concentration -- Rhizopus oryzae  ?0.3 ml (Volume)  1:20 Concentration -- Cockroach, [place]  ? ? ?1.7  ml Extract Subtotal  ?3.3  ml Diluent  ?5.0  ml Maintenance Total  ? ?Schedule:  B  ?Blue Vial (1:100,000): Schedule B (6 doses)  ?Yellow Vial (1:10,000): Schedule B (6 doses)  ?Green Vial (1:1,000): Schedule B (6 doses)  ?Red Vial (1:100): Schedule A (10 doses)  ? ?Special Instructions: schedule B ?

## 2022-01-21 NOTE — Progress Notes (Signed)
Can we get her signed up for hymenoptra skin testing please?

## 2022-01-21 NOTE — Progress Notes (Signed)
AIT Rx written. ?

## 2022-01-22 DIAGNOSIS — J302 Other seasonal allergic rhinitis: Secondary | ICD-10-CM | POA: Diagnosis not present

## 2022-02-11 ENCOUNTER — Ambulatory Visit (INDEPENDENT_AMBULATORY_CARE_PROVIDER_SITE_OTHER): Payer: Medicare Other

## 2022-02-11 DIAGNOSIS — J309 Allergic rhinitis, unspecified: Secondary | ICD-10-CM | POA: Diagnosis not present

## 2022-02-11 NOTE — Progress Notes (Signed)
Immunotherapy ? ? ?Patient Details  ?Name: [name] ?MRN: [id number] ?Date of Birth: 05/05/1971 ? ?02/11/2022 ? ?Sara Hayes started injections for G-DM-Cat and CR-Molds. Patient received 0.05 of both blue vials with an expiration of 01/22/2023. Patient waited 30 minutes with ni problems. ?Following schedule: B  ?Frequency:1 time per week ?Epi-Pen:Epi-Pen Available  ?Consent signed and patient instructions given. ? ? ?Herbie Drape ?02/11/2022, 9:39 AM ? ? ?

## 2022-02-17 DIAGNOSIS — M1611 Unilateral primary osteoarthritis, right hip: Secondary | ICD-10-CM | POA: Diagnosis not present

## 2022-02-17 DIAGNOSIS — M3500 Sicca syndrome, unspecified: Secondary | ICD-10-CM | POA: Diagnosis not present

## 2022-02-17 DIAGNOSIS — Z96641 Presence of right artificial hip joint: Secondary | ICD-10-CM | POA: Diagnosis not present

## 2022-02-17 DIAGNOSIS — G35 Multiple sclerosis: Secondary | ICD-10-CM | POA: Diagnosis not present

## 2022-02-17 DIAGNOSIS — M0800 Unspecified juvenile rheumatoid arthritis of unspecified site: Secondary | ICD-10-CM | POA: Diagnosis not present

## 2022-02-18 ENCOUNTER — Ambulatory Visit (INDEPENDENT_AMBULATORY_CARE_PROVIDER_SITE_OTHER): Payer: Medicare Other

## 2022-02-18 DIAGNOSIS — J309 Allergic rhinitis, unspecified: Secondary | ICD-10-CM

## 2022-02-25 ENCOUNTER — Ambulatory Visit (INDEPENDENT_AMBULATORY_CARE_PROVIDER_SITE_OTHER): Payer: Medicare Other

## 2022-02-25 DIAGNOSIS — J309 Allergic rhinitis, unspecified: Secondary | ICD-10-CM

## 2022-02-26 DIAGNOSIS — J301 Allergic rhinitis due to pollen: Secondary | ICD-10-CM | POA: Diagnosis not present

## 2022-02-26 DIAGNOSIS — Z1211 Encounter for screening for malignant neoplasm of colon: Secondary | ICD-10-CM | POA: Diagnosis not present

## 2022-02-26 DIAGNOSIS — E039 Hypothyroidism, unspecified: Secondary | ICD-10-CM | POA: Diagnosis not present

## 2022-02-26 DIAGNOSIS — J3089 Other allergic rhinitis: Secondary | ICD-10-CM | POA: Diagnosis not present

## 2022-02-26 DIAGNOSIS — M069 Rheumatoid arthritis, unspecified: Secondary | ICD-10-CM | POA: Diagnosis not present

## 2022-02-26 DIAGNOSIS — G379 Demyelinating disease of central nervous system, unspecified: Secondary | ICD-10-CM | POA: Diagnosis not present

## 2022-02-26 DIAGNOSIS — Z79899 Other long term (current) drug therapy: Secondary | ICD-10-CM | POA: Diagnosis not present

## 2022-02-26 DIAGNOSIS — Z0001 Encounter for general adult medical examination with abnormal findings: Secondary | ICD-10-CM | POA: Diagnosis not present

## 2022-02-26 DIAGNOSIS — D6859 Other primary thrombophilia: Secondary | ICD-10-CM | POA: Diagnosis not present

## 2022-02-26 DIAGNOSIS — Z0000 Encounter for general adult medical examination without abnormal findings: Secondary | ICD-10-CM | POA: Diagnosis not present

## 2022-02-26 DIAGNOSIS — M3500 Sicca syndrome, unspecified: Secondary | ICD-10-CM | POA: Diagnosis not present

## 2022-03-04 ENCOUNTER — Ambulatory Visit (INDEPENDENT_AMBULATORY_CARE_PROVIDER_SITE_OTHER): Payer: Medicare Other

## 2022-03-04 DIAGNOSIS — J309 Allergic rhinitis, unspecified: Secondary | ICD-10-CM | POA: Diagnosis not present

## 2022-03-04 DIAGNOSIS — H25813 Combined forms of age-related cataract, bilateral: Secondary | ICD-10-CM | POA: Diagnosis not present

## 2022-03-04 DIAGNOSIS — H524 Presbyopia: Secondary | ICD-10-CM | POA: Diagnosis not present

## 2022-03-04 DIAGNOSIS — G35 Multiple sclerosis: Secondary | ICD-10-CM | POA: Diagnosis not present

## 2022-03-04 DIAGNOSIS — Z79899 Other long term (current) drug therapy: Secondary | ICD-10-CM | POA: Diagnosis not present

## 2022-03-04 DIAGNOSIS — M069 Rheumatoid arthritis, unspecified: Secondary | ICD-10-CM | POA: Diagnosis not present

## 2022-03-11 ENCOUNTER — Ambulatory Visit (INDEPENDENT_AMBULATORY_CARE_PROVIDER_SITE_OTHER): Payer: Medicare Other

## 2022-03-11 DIAGNOSIS — J309 Allergic rhinitis, unspecified: Secondary | ICD-10-CM

## 2022-03-19 ENCOUNTER — Ambulatory Visit (INDEPENDENT_AMBULATORY_CARE_PROVIDER_SITE_OTHER): Payer: Medicare Other

## 2022-03-19 DIAGNOSIS — J309 Allergic rhinitis, unspecified: Secondary | ICD-10-CM

## 2022-03-27 ENCOUNTER — Ambulatory Visit (INDEPENDENT_AMBULATORY_CARE_PROVIDER_SITE_OTHER): Payer: Medicare Other

## 2022-03-27 DIAGNOSIS — J309 Allergic rhinitis, unspecified: Secondary | ICD-10-CM | POA: Diagnosis not present

## 2022-04-01 ENCOUNTER — Ambulatory Visit (INDEPENDENT_AMBULATORY_CARE_PROVIDER_SITE_OTHER): Payer: Medicare Other

## 2022-04-01 ENCOUNTER — Other Ambulatory Visit: Payer: Medicare Other | Admitting: Family

## 2022-04-01 DIAGNOSIS — J309 Allergic rhinitis, unspecified: Secondary | ICD-10-CM

## 2022-04-04 NOTE — Progress Notes (Unsigned)
FOLLOW UP Date of Service/Encounter:  04/09/22   Subjective:  Sara Hayes (DOB: [birth year]) is a 51 y.o. female  PMHx of JIA/RA's, Sjogren's, CNS/PNS demyelinating syndrome consistent with multiple sclerosis, chronic bilateral subdural hematomas, lichen simplex chronicuswho [facility] in re-evaluation of the following: Allergic rhinitis, atopic dermatitis, asthma, venom allergy, penicillin allergy History obtained from: chart review and patient.  For Review, LV was on 01/08/2022 with Dr.Tiona Ruane seen for intial visit for allergic rhinitis, atopic dermatitis, asthma, venom allergy . At that visit we discussed allergy shots and added azelastine to her regimen.  We started olopatadine for conjunctivitis symptoms.  We started Flovent 220, 2 puffs twice a day for asthma symptoms.  Pertinent History/Diagnostics:  - Asthma: Moderate persistent.  No history of hospitalizations, no steroids in the past year.  Uses rescue inhaler around once a week.  -  spirometry (01/08/2022): ratio 102%, 87% FEV1  -Prior PFTs or spirometry: 2015-normal pulmonary function test with ratio of 90% and FEV1 of 101% - Most Recent AEC (10/14/21): 200 - Allergic Rhinitis: Moved from [place] to [place] in 2012.  Uncontrolled with daily antihistamine, Flonase, and Singulair daily.Uses restasis for dry eyes.  Follows with ophthalmologist.  Last appointment was 2022 in the Spring.  Started AIT 02/11/2022.  Vial 1 (grass, dust mite, cat) vial 2 (cockroach and molds)  - SPT environmental panel (01/08/2022): positive to cats, intradermals positive to [place] grass, mold mix 1, 2, 3 and 4, cockroach, mite mix -Hymenoptera allergy   - Hx of reaction: Developed a local reaction but then became lightheaded, no syncope, but did require emergency room treatment.  -IgE negative to honeybee, white hornet, yellow jacket, paper wasp, yellow hornet, bumblebee, tryptase 3.5 (normal baseline).   We advised follow-up skin testing for confirmation. -Penicillin allergy: Broke out in hives when younger, no other details available for review.  Testing followed by challenge if indicated offered.  Today presents for follow-up. Asthma- doing great, taking Flovent 220, 2 puffs twice a day, has not needed rescue inhaler in the last 2 weeks, but staying inside due to heat which is a trigger for her.   Allergic rhinitis: allergy injections are going well, takes singulair, cetirizine and flonase every day.   She is scheduled for hymenoptera allergy skin testing since her blood work was negative.  She has not had any accidental stings since her last visit. She is interested in undergoing penicillin allergy testing in the future.  Allergies as of 04/09/2022       Reactions   Amoxicillin Hives   Penicillins Hives   Has patient had a PCN reaction causing immediate rash, facial/tongue/throat swelling, SOB or lightheadedness with hypotension: YES Has patient had a PCN reaction causing severe rash involving mucus membranes or skin necrosis: yes Has patient had a PCN reaction that required hospitalization: no Has patient had a PCN reaction occurring within the last 10 years: no If all of the above answers are "NO", then may proceed with Cephalosporin use.   Aspirin Hives   Latex Hives, Other (See Comments)   Reaction Type: Allergy; Severity: Moderate; Reaction(s): reddened skin   Naproxen Other (See Comments)   Unknown, large blood clots   Niacin And Related Other (See Comments)   flushing        Medication List        Accurate as of April 09, 2022  1:05 PM. If you have any questions, ask your nurse or doctor.  amoxicillin 400 MG/5ML suspension Commonly known as: AMOXIL Do not take.  Please bring to your allergy appointment for oral challenge if indicated following testing Started by: Verlee Monte, MD   Aubagio 14 MG Tabs Generic drug: Teriflunomide Take 1 tablet by mouth  daily.   azelastine 0.1 % nasal spray Commonly known as: ASTELIN Place 2 sprays into both nostrils 2 (two) times daily as needed for rhinitis. Use in each nostril as directed   baclofen 10 MG tablet Commonly known as: LIORESAL Take 10 mg by mouth 3 (three) times daily as needed for muscle spasms. 2-3 x daily   calcium carbonate 500 MG chewable tablet Commonly known as: TUMS - dosed in mg elemental calcium Chew 1 tablet by mouth daily.   clonazePAM 0.5 MG tablet Commonly known as: KLONOPIN Take 0.5 mg by mouth 3 (three) times daily.   cycloSPORINE 0.05 % ophthalmic emulsion Commonly known as: RESTASIS Place 1 drop into both eyes 2 (two) times daily.   EPINEPHrine 0.3 mg/0.3 mL Soaj injection Commonly known as: EpiPen 2-Pak Inject 0.3 mg into the muscle as needed for anaphylaxis. Started by: Verlee Monte, MD   ferrous sulfate 325 (65 FE) MG EC tablet Take 325 mg by mouth 3 (three) times daily with meals.   fluticasone 220 MCG/ACT inhaler Commonly known as: FLOVENT HFA Inhale 1 puff into the lungs 2 (two) times daily.   fluticasone 50 MCG/ACT nasal spray Commonly known as: FLONASE Place into both nostrils daily.   gabapentin 300 MG capsule Commonly known as: NEURONTIN Take 900-1,200 mg by mouth 3 (three) times daily. 900 mg in am and midday, and 1200 mg at night   hydrocortisone 2.5 % ointment Apply topically twice daily as need to red sandpapery rash.   hydroxychloroquine 200 MG tablet Commonly known as: PLAQUENIL Take 400 mg by mouth daily.   levocetirizine 5 MG tablet Commonly known as: XYZAL Take 5 mg by mouth every evening.   levonorgestrel 20 MCG/DAY Iud Commonly known as: MIRENA 1 each by Intrauterine route once.   Mirena (52 MG) 20 MCG/DAY Iud Generic drug: levonorgestrel Place 1 application. vaginally See admin instructions.   methocarbamol 500 MG tablet Commonly known as: ROBAXIN Take 500 mg by mouth 4 (four) times daily.   montelukast 10 MG  tablet Commonly known as: SINGULAIR Take 10 mg by mouth at bedtime.   MULTIVITAMIN GUMMIES ADULT PO Take by mouth.   nortriptyline 75 MG capsule Commonly known as: PAMELOR Take 75 mg by mouth at bedtime.   Olopatadine HCl 0.2 % Soln Apply 1 drop to eye daily as needed.   pilocarpine 5 MG tablet Commonly known as: SALAGEN Take 5 mg by mouth 3 (three) times daily as needed (dry mouth).   ProAir RespiClick 108 (90 Base) MCG/ACT Aepb Generic drug: Albuterol Sulfate INHALE 2 PUFFS BY MOUTH EVERY 4 TO 6 HOURS What changed: Another medication with the same name was removed. Continue taking this medication, and follow the directions you see here. Changed by: Verlee Monte, MD   TYLENOL ARTHRITIS PAIN PO Take by mouth.   vitamin C 500 MG tablet Commonly known as: ASCORBIC ACID Take 500 mg by mouth daily.   Vitamin D 50 MCG (2000 UT) Caps Take by mouth.   zinc sulfate 220 (50 Zn) MG capsule Take 220 mg by mouth daily.       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 37   Asthma  Biliary colic    Chronic pain syndrome    Constipation    Delayed menses    Demyelinating disease of central nervous system, unspecified (HCC)    Gallstone    History of total right hip replacement    Hypothyroidism    Idiopathic peripheral neuropathy    Iron deficiency anemia due to dietary causes    MS (multiple sclerosis) (HCC)    december 2018 ; managed by [facility] [place]    Myoclonus    Neuropathy    Ovarian cyst    Pituitary tumor    Primary osteoarthritis of hip, unspecified laterality    Rheumatic fever    age 33   Rheumatoid arthritis (HCC)    Seasonal allergic rhinitis due to pollen    Sickle cell trait (HCC)    Sjogren's disease (HCC)    Spotting    Thrombophilia (HCC)    Thyroid disease    11/2016 no longer taking med   Urinary incontinence    Past Surgical History:  Procedure Laterality Date   CHOLECYSTECTOMY N/A  06/11/2018   Procedure: LAPAROSCOPIC CHOLECYSTECTOMY;  Surgeon: Rodman Pickle, MD;  Location: [facility];  Service: General;  Laterality: N/A;   TUBAL LIGATION     Otherwise, there have been no changes to her past medical history, surgical history, family history, or social history.  ROS: All others negative except as noted per HPI.   Objective:  BP 104/70   Pulse 75   Temp (!) 97.2 F (36.2 C) (Temporal)   Resp 16   Ht 5' 4.57" (1.64 m)   Wt 197 lb 6.4 oz (89.5 kg)   SpO2 98%   BMI 33.29 kg/m  Body mass index is 33.29 kg/m. Physical Exam: General Appearance:  Alert, cooperative, no distress, appears stated age  Head:  Normocephalic, without obvious abnormality, atraumatic  Eyes:  Conjunctiva clear, EOM's intact  Nose: Nares normal, hypertrophic turbinates, normal mucosa, no visible anterior polyps, and septum midline  Throat: Lips, tongue normal; teeth and gums normal, normal posterior oropharynx and no tonsillar exudate  Neck: Supple, symmetrical  Lungs:   clear to auscultation bilaterally, Respirations unlabored, no coughing  Heart:  regular rate and rhythm and no murmur, Appears well perfused  Extremities: No edema  Skin: Skin color, texture, turgor normal, no rashes or lesions on visualized portions of skin  Neurologic: No gross deficits   Spirometry:  Tracings reviewed. Her effort: Good reproducible efforts. FVC: 2.70L FEV1: 2.06L, 87% predicted FEV1/FVC ratio: 93% Interpretation: Spirometry consistent with normal pattern.  Please see scanned spirometry results for details.  Assessment/Plan   Perennial and seasonal allergic rhinitis:partially controlled - allergen avoidance towards cats, intradermals positive to [place] grass, mold mix 1, 2, 3 and 4, cockroach, mite mix - continue allergy injections as per protocol, continue to bring epinephrine auto-injector to your appointment - Continue  Flonase (fluticasone) 1- 2 sprays in each nostril daily (can buy  over-the-counter if not covered by insurance)  Best results if used daily. - Continue Astelin (Azelastine) 1-2 sprays in each nostril twice a day as needed.  You may use this as needed for nasal congestion/itchy ears/itchy nose if desired - Continue Singulair (Montelukast) 10mg  nightly. - Continue over the counter antihistamine daily or daily as need ed.  Can increase to twice daily on days when hives occur. -Your options include Zyrtec (Cetirizine) 10mg , Claritin (Loratadine) 10mg , Allegra (Fexofenadine) 180mg , or Xyzal (Levocetirinze) 5mg   Allergic Conjunctivitis: partially controlled - Consider Allergy Eye  drops: great options include Pataday (Olopatadine) or Zaditor (ketotifen) for eye symptoms daily as needed-both sold over the counter if not covered by insurance.   -Avoid eye drops that say red eye relief as they may contain medications that dry out your eyes.  Moderate Persistent Asthma: controlled - Controller Inhaler: Continue Flovent 220, 2 puffs twice a day; This Should Be Used Everyday - Rinse mouth out after use - Rescue Inhaler: Albuterol (Proair/Ventolin) 2 puffs . Use  every 4-6 hours as needed for chest tightness, wheezing, or coughing.  Can also use 15 minutes prior to exercise if you have symptoms with activity. - Asthma is not controlled if:  - Symptoms are occurring >2 times a week OR  - >2 times a month nighttime awakenings  - You are requiring systemic steroids (prednisone/steroid injections) more than once per year  - Your require hospitalization for your asthma.  - Please call the clinic to schedule a follow up if these symptoms arise  Concern For Stinging Insect Allergy: stable - blood testing negative, follow-up skin testing in [place] scheduled - please carry Epinephrine autoinjector at all times in case of accidental sting, to be used if stung and has SKIN AND ANY OTHER SYMPTOMS - for SKIN only, can take Benadryl 2 tsp (25 mg)  - recommend medical alert  bracelet - practice avoidance measures as outlined below when possible   H/O Penicillin allergy: stable - please schedule follow-up appt at your convenience for penicillin testing followed by graded oral challenge if indicated - please refrain from taking any antihistamines at least 3 days prior to this appointment  - around 80% of individuals outgrow this allergy in ~ 10 years and carrying it as a diagnosis can prevent you from getting proper therapy if needed  Follow-up in 6 months, sooner if needed. It was a pleasure seeing you again in clinic today.   Tonny Bollman, MD  [facility]

## 2022-04-09 ENCOUNTER — Ambulatory Visit: Payer: Medicare Other | Admitting: Internal Medicine

## 2022-04-09 ENCOUNTER — Encounter: Payer: Self-pay | Admitting: Internal Medicine

## 2022-04-09 ENCOUNTER — Ambulatory Visit (INDEPENDENT_AMBULATORY_CARE_PROVIDER_SITE_OTHER): Payer: Medicare Other | Admitting: Internal Medicine

## 2022-04-09 ENCOUNTER — Ambulatory Visit: Payer: Self-pay

## 2022-04-09 VITALS — BP 104/70 | HR 75 | Temp 97.2°F | Resp 16 | Ht 64.57 in | Wt 197.4 lb

## 2022-04-09 DIAGNOSIS — Z880 Allergy status to penicillin: Secondary | ICD-10-CM

## 2022-04-09 DIAGNOSIS — J309 Allergic rhinitis, unspecified: Secondary | ICD-10-CM

## 2022-04-09 DIAGNOSIS — J302 Other seasonal allergic rhinitis: Secondary | ICD-10-CM

## 2022-04-09 DIAGNOSIS — J4540 Moderate persistent asthma, uncomplicated: Secondary | ICD-10-CM | POA: Diagnosis not present

## 2022-04-09 DIAGNOSIS — H1013 Acute atopic conjunctivitis, bilateral: Secondary | ICD-10-CM | POA: Diagnosis not present

## 2022-04-09 DIAGNOSIS — Z91038 Other insect allergy status: Secondary | ICD-10-CM

## 2022-04-09 MED ORDER — EPINEPHRINE 0.3 MG/0.3ML IJ SOAJ: 0.3000 mg | INTRAMUSCULAR | 2 refills | Status: DC | PRN

## 2022-04-09 MED ORDER — MONTELUKAST SODIUM 10 MG PO TABS
10.0000 mg | ORAL_TABLET | Freq: Every day | ORAL | 5 refills | Status: DC
Start: 2022-04-09 — End: 2023-02-11

## 2022-04-09 MED ORDER — LEVOCETIRIZINE DIHYDROCHLORIDE 5 MG PO TABS: 5.0000 mg | ORAL_TABLET | Freq: Every evening | ORAL | 5 refills | Status: DC

## 2022-04-09 MED ORDER — AMOXICILLIN 400 MG/5ML PO SUSR: ORAL | 0 refills | Status: DC

## 2022-04-09 MED ORDER — OLOPATADINE HCL 0.2 % OP SOLN: 1.0000 [drp] | Freq: Every day | OPHTHALMIC | 5 refills | Status: DC | PRN

## 2022-04-09 MED ORDER — FLUTICASONE PROPIONATE HFA 220 MCG/ACT IN AERO: 1.0000 | INHALATION_SPRAY | Freq: Two times a day (BID) | RESPIRATORY_TRACT | 5 refills | Status: DC

## 2022-04-09 NOTE — Patient Instructions (Addendum)
Perennial and seasonal allergic rhinitis: - allergen avoidance towards cats,  [place] grass, indoor and outdoor molds, cockroach, dust mites - continue allergy injections as per protocol, continue to bring epinephrine auto-injector to your appointment - Continue  Flonase (fluticasone) 1- 2 sprays in each nostril daily (can buy over-the-counter if not covered by insurance)  Best results if used daily. - Continue Astelin (Azelastine) 1-2 sprays in each nostril twice a day as needed.  You may use this as needed for nasal congestion/itchy ears/itchy nose if desired - Continue Singulair (Montelukast) '10mg'$  nightly. - Continue over the counter antihistamine daily or daily as need ed.  Can increase to twice daily on days when hives occur. -Your options include Zyrtec (Cetirizine) '10mg'$ , Claritin (Loratadine) '10mg'$ , Allegra (Fexofenadine) '180mg'$ , or Xyzal (Levocetirinze) '5mg'$   Allergic Conjunctivitis:  - Consider Allergy Eye drops: great options include Pataday (Olopatadine) or Zaditor (ketotifen) for eye symptoms daily as needed-both sold over the counter if not covered by insurance.   -Avoid eye drops that say red eye relief as they may contain medications that dry out your eyes.  Moderate Persistent Asthma: - Controller Inhaler: Continue Flovent 220, 2 puffs twice a day; This Should Be Used Everyday - Rinse mouth out after use - Rescue Inhaler: Albuterol (Proair/Ventolin) 2 puffs . Use  every 4-6 hours as needed for chest tightness, wheezing, or coughing.  Can also use 15 minutes prior to exercise if you have symptoms with activity. - Asthma is not controlled if:  - Symptoms are occurring >2 times a week OR  - >2 times a month nighttime awakenings  - You are requiring systemic steroids (prednisone/steroid injections) more than once per year  - Your require hospitalization for your asthma.  - Please call the clinic to schedule a follow up if these symptoms arise  Concern For Stinging Insect Allergy: -  blood testing negative, follow-up skin testing in [place] scheduled - please carry Epinephrine autoinjector at all times in case of accidental sting, to be used if stung and has SKIN AND ANY OTHER SYMPTOMS - for SKIN only, can take Benadryl 2 tsp (25 mg)  - recommend medical alert bracelet - practice avoidance measures as outlined below when possible   H/O Penicillin allergy: - please schedule follow-up appt at your convenience for penicillin testing followed by graded oral challenge if indicated - please refrain from taking any antihistamines at least 3 days prior to this appointment  - around 80% of individuals outgrow this allergy in ~ 10 years and carrying it as a diagnosis can prevent you from getting proper therapy if needed  Follow-up in 6 months, sooner if needed. It was a pleasure seeing you again in clinic today.  Control of Dog or Cat Allergen  Avoidance is the best way to manage a dog or cat allergy. If you have a dog or cat and are allergic to dog or cats, consider removing the dog or cat from the home. If you have a dog or cat but don't want to find it a new home, or if your family wants a pet even though someone in the household is allergic, here are some strategies that may help keep symptoms at bay:  Keep the pet out of your bedroom and restrict it to only a few rooms. Be advised that keeping the dog or cat in only one room will not limit the allergens to that room. Don't pet, hug or kiss the dog or cat; if you do, wash your hands with soap  and water. High-efficiency particulate air (HEPA) cleaners run continuously in a bedroom or living room can reduce allergen levels over time. Regular use of a high-efficiency vacuum cleaner or a central vacuum can reduce allergen levels. Giving your dog or cat a bath at least once a week can reduce airborne allergen.  Control of Mold Allergen   Mold and fungi can grow on a variety of surfaces provided certain temperature and  moisture conditions exist.  Outdoor molds grow on plants, decaying vegetation and soil.  The major outdoor mold, Alternaria and Cladosporium, are found in very high numbers during hot and dry conditions.  Generally, a late Summer - Fall peak is seen for common outdoor fungal spores.  Rain will temporarily lower outdoor mold spore count, but counts rise rapidly when the rainy period ends.  The most important indoor molds are Aspergillus and Penicillium.  Dark, humid and poorly ventilated basements are ideal sites for mold growth.  The next most common sites of mold growth are the bathroom and the kitchen.  Outdoor (Seasonal) Mold Control  Use air conditioning and keep windows closed Avoid exposure to decaying vegetation. Avoid leaf raking. Avoid grain handling. Consider wearing a face mask if working in moldy areas.    Indoor (Perennial) Mold Control   Maintain humidity below 50%. Clean washable surfaces with 5% bleach solution. Remove sources e.g. contaminated carpets.    Control of Cockroach Allergen  Cockroach allergen has been identified as an important cause of acute attacks of asthma, especially in urban settings.  There are fifty-five species of cockroach that exist in the [place], however only three, the [place], [place] species produce allergen that can affect patients with Asthma.  Allergens can be obtained from fecal particles, egg casings and secretions from cockroaches.    Remove food sources. Reduce access to water. Seal access and entry points. Spray runways with 0.5-1% Diazinon or Chlorpyrifos Blow boric acid power under stoves and refrigerator. Place bait stations (hydramethylnon) at feeding sites.  DUST MITE AVOIDANCE MEASURES:  There are three main measures that need and can be taken to avoid house dust mites:  Reduce accumulation of dust in general -reduce furniture, clothing, carpeting, books, stuffed animals, especially in bedroom  Separate  yourself from the dust -use pillow and mattress encasements (can be found at stores such as [employer] or online) -avoid direct exposure to air condition flow -use a HEPA filter device, especially in the bedroom; you can also use a HEPA filter vacuum cleaner -wipe dust with a moist towel instead of a dry towel or broom when cleaning  Decrease mites and/or their secretions -wash clothing and linen and stuffed animals at highest temperature possible, at least every 2 weeks -stuffed animals can also be placed in a bag and put in a freezer overnight  Despite the above measures, it is impossible to eliminate dust mites or their allergen completely from your home.  With the above measures the burden of mites in your home can be diminished, with the goal of minimizing your allergic symptoms.  Success will be reached only when implementing and using all means together.  Reducing Pollen Exposure  The American Academy of Allergy, Asthma and Immunology suggests the following steps to reduce your exposure to pollen during allergy seasons.    Do not hang sheets or clothing out to dry; pollen may collect on these items. Do not mow lawns or spend time around freshly cut grass; mowing stirs up pollen. Keep windows  closed at night.  Keep car windows closed while driving. Minimize morning activities outdoors, a time when pollen counts are usually at their highest. Stay indoors as much as possible when pollen counts or humidity is high and on windy days when pollen tends to remain in the air longer. Use air conditioning when possible.  Many air conditioners have filters that trap the pollen spores. Use a HEPA room air filter to remove pollen form the indoor air you breathe.

## 2022-04-10 ENCOUNTER — Telehealth: Payer: Self-pay

## 2022-04-10 NOTE — Telephone Encounter (Signed)
I called the pharmacy about recent PA request for the epi-pen. The Mylan brand epi-pen is covered by the patient's insurance and should be back in stock at the pharmacy later this week. Patient had been notified.

## 2022-04-14 ENCOUNTER — Encounter: Payer: Medicare Other | Admitting: Family

## 2022-04-15 ENCOUNTER — Ambulatory Visit (INDEPENDENT_AMBULATORY_CARE_PROVIDER_SITE_OTHER): Payer: Medicare Other

## 2022-04-15 DIAGNOSIS — J309 Allergic rhinitis, unspecified: Secondary | ICD-10-CM | POA: Diagnosis not present

## 2022-04-17 NOTE — Telephone Encounter (Signed)
PA was done on Epi pen as well:  KEY: [id number] - PA Case ID: PA - [id number] created 04/17/21 sent to [facility]

## 2022-04-22 ENCOUNTER — Other Ambulatory Visit: Payer: Self-pay | Admitting: *Deleted

## 2022-04-22 ENCOUNTER — Encounter: Payer: Self-pay | Admitting: Family

## 2022-04-22 ENCOUNTER — Ambulatory Visit (INDEPENDENT_AMBULATORY_CARE_PROVIDER_SITE_OTHER): Payer: Medicare Other | Admitting: Family

## 2022-04-22 VITALS — BP 128/82 | HR 64 | Temp 97.2°F | Resp 18

## 2022-04-22 DIAGNOSIS — J4540 Moderate persistent asthma, uncomplicated: Secondary | ICD-10-CM

## 2022-04-22 DIAGNOSIS — Z91038 Other insect allergy status: Secondary | ICD-10-CM | POA: Diagnosis not present

## 2022-04-22 MED ORDER — EPIPEN 2-PAK 0.3 MG/0.3ML IJ SOAJ: 0.3000 mg | INTRAMUSCULAR | 1 refills | Status: DC | PRN

## 2022-04-22 NOTE — Patient Instructions (Addendum)
Moderate Persistent Asthma: - Controller Inhaler: Continue Flovent 220, 2 puffs twice a day; This Should Be Used Everyday - Rinse mouth out after use - Rescue Inhaler: Albuterol (Proair/Ventolin) 2 puffs . Use  every 4-6 hours as needed for chest tightness, wheezing, or coughing.  Can also use 15 minutes prior to exercise if you have symptoms with activity. - Asthma is not controlled if:  - Symptoms are occurring >2 times a week OR  - >2 times a month nighttime awakenings  - You are requiring systemic steroids (prednisone/steroid injections) more than once per year  - Your require hospitalization for your asthma.  - Please call the clinic to schedule a follow up if these symptoms arise  Concern For Stinging Insect Allergy: Skin testing today is negative to honeybee, yellow jacket, yellow hornet, white hornet, and wasp - blood testing negative on January 08, 2022 -For a year please carry your epinephrine autoinjector at all times in case of accidental sting, to be used if stung and has SKIN AND ANY OTHER SYMPTOMS.  During this period we will assess the need for future refills of your epinephrine autoinjector device - for SKIN only, can take Benadryl 2 tsp (25 mg)  - practice avoidance measures as outlined below when possible     Keep already scheduled follow-up appointment on October 15, 2022 at 8:30 AM with Dr. Simona Huh or sooner if needed

## 2022-04-22 NOTE — Progress Notes (Signed)
[address] [place] [address] Dept: [phone]  FOLLOW UP NOTE  Patient ID: Sara Hayes, female    DOB: 04/07/1971  Age: 51 y.o. MRN: [id number] Date of Office Visit: 04/22/2022  Assessment  Chief Complaint: Allergy Testing (Venom )  HPI Sara Hayes is a 51 year old female who presents today for venom skin testing.  She was last seen on April 09, 2022 by Dr. Maurine Minister for perennial and seasonal allergic rhinitis, allergic conjunctivitis, moderate persistent asthma-controlled, concern for stinging insect allergy, and history of penicillin allergy.  She denies any new diagnosis or surgery since her last office visit.  She reports a few years back she was st[place] yellow jacket [place]  She became lightheaded and clammy.  She was near the medic tent and reports that they placed a cream a of some sort on her and a ice pack on her neck.  She eventually came around.  She then also reports last summer she was stung by a wasp on her leg and the area became swollen and "pussy and oozy" she went and lie down in bed because it was painful.  She cannot remember if she had any concomitant cardiorespiratory and gastrointestinal symptoms.  She has been off all antihistamines 3 days prior to this appointment.  She is in good health today and denies any cardiorespiratory, gastrointestinal, and cutaneous symptoms.  All questions answered and informed consent signed.   Drug Allergies:  Allergies  Allergen Reactions   Amoxicillin Hives   Penicillins Hives    Has patient had a PCN reaction causing immediate rash, facial/tongue/throat swelling, SOB or lightheadedness with hypotension: YES Has patient had a PCN reaction causing severe rash involving mucus membranes or skin necrosis: yes Has patient had a PCN reaction that required hospitalization: no Has patient had a PCN reaction occurring within the last 10 years: no If all of the above answers are "NO", then may proceed  with Cephalosporin use.    Aspirin Hives   Latex Hives and Other (See Comments)    Reaction Type: Allergy; Severity: Moderate; Reaction(s): reddened skin    Naproxen Other (See Comments)    Unknown, large blood clots   Niacin And Related Other (See Comments)    flushing    Review of Systems: Review of Systems  Constitutional:  Negative for chills and fever.  HENT:         Denies rhinorrhea, nasal congestion, and postnasal drip  Eyes:        Reports itchy eyes  Respiratory:  Negative for cough, shortness of breath and wheezing.        Denies cough, wheeze, tightness in chest, shortness of breath, and nocturnal awakenings due to breathing problem  Cardiovascular:  Negative for chest pain and palpitations.  Gastrointestinal:  Negative for abdominal pain, diarrhea, nausea and vomiting.  Genitourinary:  Negative for frequency.  Skin:  Negative for itching and rash.  Neurological:  Negative for headaches.  Endo/Heme/Allergies:  Positive for environmental allergies.     Physical Exam: BP 128/82   Pulse 64   Temp (!) 97.2 F (36.2 C) (Temporal)   Resp 18   SpO2 98%      Physical Exam Constitutional:      Appearance: Normal appearance.  HENT:     Head: Normocephalic and atraumatic.     Comments: Pharynx normal, eyes normal, ears normal, nose: Bilateral lower turbinates moderately edematous and pale with no drainage noted    Right  Ear: Tympanic membrane, ear canal and external ear normal.     Left Ear: Tympanic membrane, ear canal and external ear normal.     Mouth/Throat:     Mouth: Mucous membranes are moist.     Pharynx: Oropharynx is clear.  Eyes:     Conjunctiva/sclera: Conjunctivae normal.  Cardiovascular:     Rate and Rhythm: Regular rhythm.     Pulses: Normal pulses.     Heart sounds: Normal heart sounds.  Pulmonary:     Effort: Pulmonary effort is normal.     Breath sounds: Normal breath sounds.     Comments: Lungs clear to auscultation Musculoskeletal:      Cervical back: Neck supple.  Skin:    General: Skin is warm.     Comments: No rashes or urticarial lesions noted  Neurological:     Mental Status: She is alert and oriented to person, place, and time.  Psychiatric:        Mood and Affect: Mood normal.        Behavior: Behavior normal.        Thought Content: Thought content normal.        Judgment: Judgment normal.     Diagnostics: FVC 2.69 L (91%), FEV1 2.26 L (95%).  Predicted FVC 2.96 L, predicted FEV1 2.38 L.  Spirometry indicates normal respiratory function.   Venom Testing - 04/22/22 0933     Time Antigen Placed 0933    Location Arm    Number of Test 27    Control Negative   puncture 1.0 ug/ml   Histamine 3+   histamine plus 3   Honey Bee Negative   1.0 puncture negative   Yellow Jacket Negative   puncture 1.0  negative   Yellow Hornet Negative   puncture 1.0 negative   White Hornet Negative   puncture 1.0 negative   Wasp Negative   puncture 1.0 negative   Control Negative   intradermal 0.001 ug/ml   Honey Bee Negative   intradermal 0.001 negative   Yellow Jacket Negative   intradermal 0.001 negative   Yellow Hornet Negative   intradermal 0.001 negative   White Hornet Negative   intradermal 0.001 negative   Wasp Negative   intradermal 0.001 negative   Honey Bee Negative   intradermal 0.01 negative   Yellow Jacket Negative   intradermal 0.01 negative   Yellow Hornet Negative   intradermal 0.01 negative   White Hornet Negative   intradermal 0.01 negative   Wasp Negative   intradermal 0.01 negative   Honey Bee Negative   intradermal 0.1 negative   Yellow Jacket Negative   intradermal 0.1 negative   Yellow Hornet Negative   intradermal 0.1 negative   White Hornet Negative   intradermal 0.1 negative   Wasp Negative   intradermal 0.1 negative   Honey Bee Negative   intradermal 1.0 negative   Yellow Jacket Negative   intradermal 1.0 negative   Yellow Hornet Negative   intradermal 1.0 negative   White Hornet Negative    intradermal 1.0 negative   Wasp Negative   intradermal 1.0 negative            Assessment and Plan: 1. 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 Moderate persistent asthma, unspecified whether complicated     No orders of the defined types were placed in this encounter.   Patient Instructions  Moderate Persistent Asthma: - Controller Inhaler: Continue Flovent 220, 2 puffs twice a day; This Should Be Used  Everyday - Rinse mouth out after use - Rescue Inhaler: Albuterol (Proair/Ventolin) 2 puffs . Use  every 4-6 hours as needed for chest tightness, wheezing, or coughing.  Can also use 15 minutes prior to exercise if you have symptoms with activity. - Asthma is not controlled if:  - Symptoms are occurring >2 times a week OR  - >2 times a month nighttime awakenings  - You are requiring systemic steroids (prednisone/steroid injections) more than once per year  - Your require hospitalization for your asthma.  - Please call the clinic to schedule a follow up if these symptoms arise  Concern For Stinging Insect Allergy: Skin testing today is negative to honeybee, yellow jacket, yellow hornet, white hornet, and wasp - blood testing negative on January 08, 2022 -For a year please carry your epinephrine autoinjector at all times in case of accidental sting, to be used if stung and has SKIN AND ANY OTHER SYMPTOMS.  During this period we will assess the need for future refills of your epinephrine autoinjector device - for SKIN only, can take Benadryl 2 tsp (25 mg)  - practice avoidance measures as outlined below when possible     Keep already scheduled follow-up appointment on October 15, 2022 at 8:30 AM with Dr. Maurine Minister or sooner if needed   Return in about 6 months (around 10/15/2022), or if symptoms worsen or fail to improve.    Thank you for the opportunity to care for this patient.  Please do not hesitate to contact me with questions.  Nehemiah Settle, FNP [facility]

## 2022-04-23 NOTE — Addendum Note (Signed)
Addended by: Gerre Pebbles A on: 04/23/2022 09:14 AM   Modules accepted: Orders

## 2022-04-24 ENCOUNTER — Ambulatory Visit (INDEPENDENT_AMBULATORY_CARE_PROVIDER_SITE_OTHER): Payer: Medicare Other

## 2022-04-24 DIAGNOSIS — J309 Allergic rhinitis, unspecified: Secondary | ICD-10-CM | POA: Diagnosis not present

## 2022-05-01 ENCOUNTER — Ambulatory Visit (INDEPENDENT_AMBULATORY_CARE_PROVIDER_SITE_OTHER): Payer: Medicare Other

## 2022-05-01 DIAGNOSIS — J309 Allergic rhinitis, unspecified: Secondary | ICD-10-CM

## 2022-05-06 DIAGNOSIS — D649 Anemia, unspecified: Secondary | ICD-10-CM | POA: Diagnosis not present

## 2022-05-06 DIAGNOSIS — M0880 Other juvenile arthritis, unspecified site: Secondary | ICD-10-CM | POA: Diagnosis not present

## 2022-05-07 ENCOUNTER — Ambulatory Visit (INDEPENDENT_AMBULATORY_CARE_PROVIDER_SITE_OTHER): Payer: Medicare Other

## 2022-05-07 DIAGNOSIS — J309 Allergic rhinitis, unspecified: Secondary | ICD-10-CM

## 2022-05-13 ENCOUNTER — Ambulatory Visit (INDEPENDENT_AMBULATORY_CARE_PROVIDER_SITE_OTHER): Payer: Medicare Other | Admitting: *Deleted

## 2022-05-13 DIAGNOSIS — J309 Allergic rhinitis, unspecified: Secondary | ICD-10-CM | POA: Diagnosis not present

## 2022-05-21 ENCOUNTER — Ambulatory Visit (INDEPENDENT_AMBULATORY_CARE_PROVIDER_SITE_OTHER): Payer: Medicare Other

## 2022-05-21 DIAGNOSIS — J309 Allergic rhinitis, unspecified: Secondary | ICD-10-CM | POA: Diagnosis not present

## 2022-05-28 ENCOUNTER — Ambulatory Visit (INDEPENDENT_AMBULATORY_CARE_PROVIDER_SITE_OTHER): Payer: Medicare Other

## 2022-05-28 DIAGNOSIS — J309 Allergic rhinitis, unspecified: Secondary | ICD-10-CM | POA: Diagnosis not present

## 2022-06-05 ENCOUNTER — Ambulatory Visit (INDEPENDENT_AMBULATORY_CARE_PROVIDER_SITE_OTHER): Payer: Medicare Other

## 2022-06-05 DIAGNOSIS — J309 Allergic rhinitis, unspecified: Secondary | ICD-10-CM | POA: Diagnosis not present

## 2022-06-11 ENCOUNTER — Ambulatory Visit (INDEPENDENT_AMBULATORY_CARE_PROVIDER_SITE_OTHER): Payer: Medicare Other | Admitting: *Deleted

## 2022-06-11 DIAGNOSIS — J309 Allergic rhinitis, unspecified: Secondary | ICD-10-CM | POA: Diagnosis not present

## 2022-06-13 DIAGNOSIS — G35 Multiple sclerosis: Secondary | ICD-10-CM | POA: Diagnosis not present

## 2022-06-17 ENCOUNTER — Ambulatory Visit (INDEPENDENT_AMBULATORY_CARE_PROVIDER_SITE_OTHER): Payer: Medicare Other | Admitting: *Deleted

## 2022-06-17 DIAGNOSIS — J309 Allergic rhinitis, unspecified: Secondary | ICD-10-CM | POA: Diagnosis not present

## 2022-06-26 ENCOUNTER — Ambulatory Visit (INDEPENDENT_AMBULATORY_CARE_PROVIDER_SITE_OTHER): Payer: Medicare Other

## 2022-06-26 DIAGNOSIS — J309 Allergic rhinitis, unspecified: Secondary | ICD-10-CM

## 2022-07-02 DIAGNOSIS — Z1211 Encounter for screening for malignant neoplasm of colon: Secondary | ICD-10-CM | POA: Diagnosis not present

## 2022-07-02 DIAGNOSIS — M069 Rheumatoid arthritis, unspecified: Secondary | ICD-10-CM | POA: Diagnosis not present

## 2022-07-02 DIAGNOSIS — Z23 Encounter for immunization: Secondary | ICD-10-CM | POA: Diagnosis not present

## 2022-07-02 DIAGNOSIS — G379 Demyelinating disease of central nervous system, unspecified: Secondary | ICD-10-CM | POA: Diagnosis not present

## 2022-07-03 DIAGNOSIS — G35 Multiple sclerosis: Secondary | ICD-10-CM | POA: Diagnosis not present

## 2022-07-04 ENCOUNTER — Ambulatory Visit (INDEPENDENT_AMBULATORY_CARE_PROVIDER_SITE_OTHER): Payer: Medicare Other

## 2022-07-04 DIAGNOSIS — J309 Allergic rhinitis, unspecified: Secondary | ICD-10-CM

## 2022-07-10 ENCOUNTER — Ambulatory Visit (INDEPENDENT_AMBULATORY_CARE_PROVIDER_SITE_OTHER): Payer: Medicare Other

## 2022-07-10 DIAGNOSIS — J309 Allergic rhinitis, unspecified: Secondary | ICD-10-CM | POA: Diagnosis not present

## 2022-07-16 ENCOUNTER — Ambulatory Visit (INDEPENDENT_AMBULATORY_CARE_PROVIDER_SITE_OTHER): Payer: Medicare Other

## 2022-07-16 DIAGNOSIS — J309 Allergic rhinitis, unspecified: Secondary | ICD-10-CM

## 2022-07-23 ENCOUNTER — Ambulatory Visit (INDEPENDENT_AMBULATORY_CARE_PROVIDER_SITE_OTHER): Payer: Medicare Other | Admitting: *Deleted

## 2022-07-23 DIAGNOSIS — J309 Allergic rhinitis, unspecified: Secondary | ICD-10-CM | POA: Diagnosis not present

## 2022-07-30 ENCOUNTER — Ambulatory Visit (INDEPENDENT_AMBULATORY_CARE_PROVIDER_SITE_OTHER): Payer: Medicare Other | Admitting: *Deleted

## 2022-07-30 DIAGNOSIS — J309 Allergic rhinitis, unspecified: Secondary | ICD-10-CM

## 2022-08-01 ENCOUNTER — Other Ambulatory Visit: Payer: Self-pay | Admitting: Internal Medicine

## 2022-08-01 ENCOUNTER — Telehealth: Payer: Self-pay | Admitting: Internal Medicine

## 2022-08-01 MED ORDER — ALBUTEROL SULFATE HFA 108 (90 BASE) MCG/ACT IN AERS: 2.0000 | INHALATION_SPRAY | RESPIRATORY_TRACT | 3 refills | Status: DC | PRN

## 2022-08-01 NOTE — Telephone Encounter (Signed)
Patient is feeling sick, patient has been coughing up phlegm and has been taking mucinex dm. Patient has also has had to use her flovent inhaler more often because of this. Patient is requesting for rescue inhaler to be sent in as it does not seem it was sent in at last visit.  [place], [place] [address]   Best contact number: [phone]

## 2022-08-01 NOTE — Telephone Encounter (Signed)
PHARMACY STATES VENTOLIN IS NOT COVERED, THEY WOULD LIKE TO SWITCH IT TO PROAIR.

## 2022-08-01 NOTE — Telephone Encounter (Signed)
Called patient and advised. Patient verbalized understanding.  

## 2022-08-01 NOTE — Telephone Encounter (Signed)
Please inform patient that her inhaler has been sent in-it will be a different brand of albuterol as they no longer make proair.  It is the same medication, but the device may look different.  Thanks

## 2022-08-01 NOTE — Telephone Encounter (Signed)
Patient has been notified that the pharmacy was able to switth the inhaler to the Proair version of the albuterol inhaler.

## 2022-08-04 ENCOUNTER — Other Ambulatory Visit: Payer: Self-pay | Admitting: *Deleted

## 2022-08-04 MED ORDER — PROAIR HFA 108 (90 BASE) MCG/ACT IN AERS: 2.0000 | INHALATION_SPRAY | RESPIRATORY_TRACT | 1 refills | Status: DC | PRN

## 2022-08-04 NOTE — Telephone Encounter (Signed)
Proair has been sent in to pharmacy. Called patient and left a detailed voicemail advising per [facility] permission.

## 2022-08-04 NOTE — Telephone Encounter (Signed)
Patient states she spoke to pharmacist about getting the proair and pharmacist stated they could fill it but would need a prescription. I advised patient of Dr. Rosine Beat message. Patient stated the pharmacist told her that they can fill the proair. Patient also wants to  know if proair cannot be filled, is there something else that could be sent in that would be easier to use as she has arthritis.  Please advise  Best contact number: [phone]

## 2022-08-05 NOTE — Progress Notes (Unsigned)
FOLLOW UP Date of Service/Encounter:  08/06/22   Subjective:  Sara Hayes (DOB: April 27, 1971) is a 51 y.o. female who [facility] in re-evaluation of the following: Asthma, allergic rhinitis, history of venom allergy History obtained from: chart review and patient.  For Review, LV was on 04/22/22  with Nehemiah Settle, FNP seen for  venom testing which was negative to honeybee, yellow jacket, yellow hornet, white hornet .  Today presents for follow-up. She has proair respiclick as well as a controller diskus-and prefers this over other HFA medications because of her arthritis.  She is getting better from how she was last week. She has a lot of phlegm and shortness of breath, but this has slowly improved over the last week.  She did not come for her allergy injection last week due to this flare.  She is unsure what causes current flare. She was using her rescue multiple times per day last week, but now she is only using prior to walking on the treadmill and maybe one other time per day-a significant improvement. She feels much better, and is having less phlegm.  She is using Mucinex DM 2-4 times per day to help with this congestion.  She feels well enough to get her allergy injection today, which she received in clinic and has tolerated. Her main concern is with regards to her inhaler devices as she is having a difficult time using HFA because of her arthritis.  ------------------ Pertinent History/Diagnostics:  Asthma:  Moderate persistent.  No history of hospitalizations, no steroids in the past year.  Uses rescue inhaler around once a week. -  spirometry (01/08/2022): ratio 102%, 87% FEV1 -Prior PFTs or spirometry: 2015-normal pulmonary function test with ratio of 90% and FEV1 of 101%- - Most Recent AEC (10/14/21): 200 Allergic Rhinitis:  Moved from [place] to [place] in 2012.  Uncontrolled with daily antihistamine, Flonase, and Singulair  daily.Uses restasis for dry eyes.  Follows with ophthalmologist.  Last appointment was 2022 in the Spring.  Started AIT 02/11/2022.  Receiving Vial 1 (grass, dust mite, cat) vial 2 (cockroach and molds) - SPT environmental panel (01/08/2022): positive to cats, intradermals positive to [place] grass, mold mix 1, 2, 3 and 4, cockroach, mite mix Hymenoptera allergy   - Hx of reaction: Developed a local reaction but then became lightheaded, no syncope, but did require emergency room treatment. -IgE negative to honeybee, white hornet, yellow jacket, paper wasp, yellow hornet, bumblebee, tryptase 3.5 (normal baseline).   - skin testing 04/22/22: negative to honeybee, yellow jacket, yellow hornet, white hornet, and wasp Penicillin allergy:  Broke out in hives when younger, no other details available for review.  Testing followed by challenge if indicated offered.    Allergies as of 08/06/2022       Reactions   Amoxicillin Hives   Penicillins Hives   Has patient had a PCN reaction causing immediate rash, facial/tongue/throat swelling, SOB or lightheadedness with hypotension: YES Has patient had a PCN reaction causing severe rash involving mucus membranes or skin necrosis: yes Has patient had a PCN reaction that required hospitalization: no Has patient had a PCN reaction occurring within the last 10 years: no If all of the above answers are "NO", then may proceed with Cephalosporin use.   Aspirin Hives   Latex Hives, Other (See Comments)   Reaction Type: Allergy; Severity: Moderate; Reaction(s): reddened skin   Naproxen Other (See Comments)   Unknown, large blood clots  Niacin And Related Other (See Comments)   flushing        Medication List        Accurate as of August 06, 2022  1:30 PM. If you have any questions, ask your nurse or doctor.          STOP taking these medications    albuterol 108 (90 Base) MCG/ACT inhaler Commonly known as: Ventolin HFA Stopped by: Verlee Monte,  MD   amoxicillin 400 MG/5ML suspension Commonly known as: AMOXIL Stopped by: Verlee Monte, MD   baclofen 10 MG tablet Commonly known as: LIORESAL Stopped by: Verlee Monte, MD   ProAir HFA 108 [phone] Base) MCG/ACT inhaler Generic drug: albuterol Stopped by: Verlee Monte, MD       TAKE these medications    ascorbic acid 500 MG tablet Commonly known as: VITAMIN C Take 500 mg by mouth daily.   Aubagio 14 MG Tabs Generic drug: Teriflunomide Take 1 tablet by mouth daily.   azelastine 0.1 % nasal spray Commonly known as: ASTELIN Place 2 sprays into both nostrils 2 (two) times daily as needed for rhinitis. Use in each nostril as directed   calcium carbonate 500 MG chewable tablet Commonly known as: TUMS - dosed in mg elemental calcium Chew 1 tablet by mouth daily.   clonazePAM 0.5 MG tablet Commonly known as: KLONOPIN Take 0.5 mg by mouth 3 (three) times daily.   cycloSPORINE 0.05 % ophthalmic emulsion Commonly known as: RESTASIS Place 1 drop into both eyes 2 (two) times daily.   EpiPen 2-Pak 0.3 mg/0.3 mL Soaj injection Generic drug: EPINEPHrine Inject 0.3 mg into the muscle as needed for anaphylaxis. What changed: Another medication with the same name was removed. Continue taking this medication, and follow the directions you see here. Changed by: Verlee Monte, MD   ferrous sulfate 325 (65 FE) MG EC tablet Take 325 mg by mouth 3 (three) times daily with meals.   fluticasone 220 MCG/ACT inhaler Commonly known as: FLOVENT HFA Inhale 1 puff into the lungs 2 (two) times daily.   fluticasone 50 MCG/ACT nasal spray Commonly known as: FLONASE Place into both nostrils daily.   folic acid 1 MG tablet Commonly known as: FOLVITE Take 1 mg by mouth daily.   gabapentin 300 MG capsule Commonly known as: NEURONTIN Take 900-1,200 mg by mouth 3 (three) times daily. 900 mg in am and midday, and 1200 mg at night   hydrocortisone 2.5 % ointment Apply topically twice daily  as need to red sandpapery rash.   hydroxychloroquine 200 MG tablet Commonly known as: PLAQUENIL Take 400 mg by mouth daily.   levocetirizine 5 MG tablet Commonly known as: XYZAL Take 1 tablet (5 mg total) by mouth every evening.   levonorgestrel 20 MCG/DAY Iud Commonly known as: MIRENA 1 each by Intrauterine route once.   Mirena (52 MG) 20 MCG/DAY Iud Generic drug: levonorgestrel Place 1 application. vaginally See admin instructions.   methocarbamol 500 MG tablet Commonly known as: ROBAXIN Take 500 mg by mouth 4 (four) times daily.   methotrexate 2.5 MG tablet Commonly known as: RHEUMATREX Take by mouth.   montelukast 10 MG tablet Commonly known as: SINGULAIR Take 1 tablet (10 mg total) by mouth at bedtime.   MULTIVITAMIN GUMMIES ADULT PO Take by mouth.   nortriptyline 75 MG capsule Commonly known as: PAMELOR Take 75 mg by mouth at bedtime.   Olopatadine HCl 0.2 % Soln Apply 1 drop to eye daily as needed.  pilocarpine 5 MG tablet Commonly known as: SALAGEN Take by mouth.   pilocarpine 5 MG tablet Commonly known as: SALAGEN Take 5 mg by mouth 3 (three) times daily as needed (dry mouth).   PreviDent 5000 Booster Plus 1.1 % Pste Generic drug: Sodium Fluoride Place onto teeth 2 (two) times daily.   TYLENOL ARTHRITIS PAIN PO Take by mouth.   Vitamin D 50 MCG (2000 UT) Caps Take by mouth.   zinc sulfate 220 (50 Zn) MG capsule Take 220 mg by mouth daily.       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 74   Asthma    Biliary colic    Chronic pain syndrome    Constipation    Delayed menses    Demyelinating disease of central nervous system, unspecified (HCC)    Gallstone    History of total right hip replacement    Hypothyroidism    Idiopathic peripheral neuropathy    Iron deficiency anemia due to dietary causes    MS (multiple sclerosis) (HCC)    december 2018 ; managed by [facility] [place]     Myoclonus    Neuropathy    Ovarian cyst    Pituitary tumor    Primary osteoarthritis of hip, unspecified laterality    Rheumatic fever    age 74   Rheumatoid arthritis (HCC)    Seasonal allergic rhinitis due to pollen    Sickle cell trait (HCC)    Sjogren's disease (HCC)    Spotting    Thrombophilia (HCC)    Thyroid disease    11/2016 no longer taking med   Urinary incontinence    Past Surgical History:  Procedure Laterality Date   CHOLECYSTECTOMY N/A 06/11/2018   Procedure: LAPAROSCOPIC CHOLECYSTECTOMY;  Surgeon: Kinsinger, De Blanch, MD;  Location: [facility];  Service: General;  Laterality: N/A;   TUBAL LIGATION     Otherwise, there have been no changes to her past medical history, surgical history, family history, or social history.  ROS: All others negative except as noted per HPI.   Objective:  BP 124/78   Pulse 62   Temp 97.9 F (36.6 C) (Temporal)   Resp 16   SpO2 98%  There is no height or weight on file to calculate BMI. Physical Exam: General Appearance:  Alert, cooperative, no distress, appears stated age  Head:  Normocephalic, without obvious abnormality, atraumatic  Eyes:  Conjunctiva clear, EOM's intact  Nose: Nares normal, hypertrophic turbinates, normal mucosa, no visible anterior polyps, and septum midline  Throat: Lips, tongue normal; teeth and gums normal, normal posterior oropharynx  Neck: Supple, symmetrical  Lungs:   clear to auscultation bilaterally, Respirations unlabored, no coughing  Heart:  regular rate and rhythm and no murmur, Appears well perfused  Extremities: No edema  Skin: Skin color, texture, turgor normal, no rashes or lesions on visualized portions of skin  Neurologic: No gross deficits   Spirometry:  Tracings reviewed. Her effort: Good reproducible efforts. FVC: 2.73L FEV1: 2.17L, 88% predicted FEV1/FVC ratio: 0.79 Interpretation: Spirometry consistent with normal pattern.  Please see scanned spirometry results for  details.   Assessment/Plan   Moderate Persistent Asthma:  recent flare-now improving. - Controller Inhaler: Start Flovent 250 diskus, 1 puff twice a day; This Should Be Used Everyday - Rinse mouth out after use - Rescue Inhaler: Albuterol (Proair/Ventolin) 2 puffs . Use  every 4-6 hours as needed for chest tightness, wheezing, or coughing.  Can also use 15 minutes prior to exercise if you have symptoms with activity. - Asthma is not controlled if:  - Symptoms are occurring >2 times a week OR  - >2 times a month nighttime awakenings  - You are requiring systemic steroids (prednisone/steroid injections) more than once per year  - Your require hospitalization for your asthma.  - Please call the clinic to schedule a follow up if these symptoms arise  Perennial and seasonal allergic rhinitis: Not controlled - allergen avoidance towards cats, intradermals positive to [place] grass, mold mix 1, 2, 3 and 4, cockroach, mite mix - continue allergy injections as per protocol, continue to bring epinephrine auto-injector to your appointment - Continue  Flonase (fluticasone) 1- 2 sprays in each nostril daily (can buy over-the-counter if not covered by insurance)  Best results if used daily. - Continue Astelin (Azelastine) 1-2 sprays in each nostril twice a day as needed.  You may use this as needed for nasal congestion/itchy ears/itchy nose if desired - Continue Singulair (Montelukast) 10mg  nightly. - Continue over the counter antihistamine daily or daily as need ed.  Can increase to twice daily on days when hives occur. -Your options include Zyrtec (Cetirizine) 10mg , Claritin (Loratadine) 10mg , Allegra (Fexofenadine) 180mg , or Xyzal (Levocetirinze) 5mg    Allergic Conjunctivitis: Controlled - Consider Allergy Eye drops: great options include Pataday (Olopatadine) or Zaditor (ketotifen) for eye symptoms daily as needed-both sold over the counter if not covered by insurance.   -Avoid eye drops that say  red eye relief as they may contain medications that dry out your eyes.  Concern For Stinging Insect Allergy: Stable Skin testing and blood work negative to honeybee, yellow jacket, yellow hornet, white hornet, and wasp -For a year please carry your epinephrine autoinjector at all times in case of accidental sting, to be used if stung and has SKIN AND ANY OTHER SYMPTOMS.  During this period we will assess the need for future refills of your epinephrine autoinjector device - for SKIN only, can take Benadryl 2 tsp (25 mg)  - practice avoidance measures as outlined below when possible  Follow-up in 6 months, sooner if needed.  Tonny Bollman, MD  [facility]

## 2022-08-06 ENCOUNTER — Ambulatory Visit: Payer: Self-pay

## 2022-08-06 ENCOUNTER — Encounter: Payer: Self-pay | Admitting: Internal Medicine

## 2022-08-06 ENCOUNTER — Ambulatory Visit (INDEPENDENT_AMBULATORY_CARE_PROVIDER_SITE_OTHER): Payer: Medicare Other | Admitting: Internal Medicine

## 2022-08-06 VITALS — BP 124/78 | HR 62 | Temp 97.9°F | Resp 16

## 2022-08-06 DIAGNOSIS — J309 Allergic rhinitis, unspecified: Secondary | ICD-10-CM | POA: Diagnosis not present

## 2022-08-06 DIAGNOSIS — J4540 Moderate persistent asthma, uncomplicated: Secondary | ICD-10-CM

## 2022-08-06 MED ORDER — PROAIR RESPICLICK 108 (90 BASE) MCG/ACT IN AEPB: 1.0000 | INHALATION_SPRAY | Freq: Four times a day (QID) | RESPIRATORY_TRACT | 3 refills | Status: DC | PRN

## 2022-08-06 MED ORDER — FLOVENT DISKUS 250 MCG/ACT IN AEPB: 1.0000 | INHALATION_SPRAY | Freq: Two times a day (BID) | RESPIRATORY_TRACT | 3 refills | Status: DC

## 2022-08-06 NOTE — Patient Instructions (Addendum)
Moderate Persistent Asthma: - Controller Inhaler: Start Flovent 250 diskus, 1 puff twice a day; This Should Be Used Everyday - Rinse mouth out after use - Rescue Inhaler: Albuterol (Proair/Ventolin) 2 puffs . Use  every 4-6 hours as needed for chest tightness, wheezing, or coughing.  Can also use 15 minutes prior to exercise if you have symptoms with activity. - Asthma is not controlled if:  - Symptoms are occurring >2 times a week OR  - >2 times a month nighttime awakenings  - You are requiring systemic steroids (prednisone/steroid injections) more than once per year  - Your require hospitalization for your asthma.  - Please call the clinic to schedule a follow up if these symptoms arise  Perennial and seasonal allergic rhinitis: - allergen avoidance towards cats, intradermals positive to [place] grass, mold mix 1, 2, 3 and 4, cockroach, mite mix - continue allergy injections as per protocol, continue to bring epinephrine auto-injector to your appointment - Continue  Flonase (fluticasone) 1- 2 sprays in each nostril daily (can buy over-the-counter if not covered by insurance)  Best results if used daily. - Continue Astelin (Azelastine) 1-2 sprays in each nostril twice a day as needed.  You may use this as needed for nasal congestion/itchy ears/itchy nose if desired - Continue Singulair (Montelukast) '10mg'$  nightly. - Continue over the counter antihistamine daily or daily as need ed.  Can increase to twice daily on days when hives occur. -Your options include Zyrtec (Cetirizine) '10mg'$ , Claritin (Loratadine) '10mg'$ , Allegra (Fexofenadine) '180mg'$ , or Xyzal (Levocetirinze) '5mg'$    Allergic Conjunctivitis:  - Consider Allergy Eye drops: great options include Pataday (Olopatadine) or Zaditor (ketotifen) for eye symptoms daily as needed-both sold over the counter if not covered by insurance.   -Avoid eye drops that say red eye relief as they may contain medications that dry out your eyes.  Concern For  Stinging Insect Allergy: Skin testing and blood work negative to honeybee, yellow jacket, yellow hornet, white hornet, and wasp -For a year please carry your epinephrine autoinjector at all times in case of accidental sting, to be used if stung and has SKIN AND ANY OTHER SYMPTOMS.  During this period we will assess the need for future refills of your epinephrine autoinjector device - for SKIN only, can take Benadryl 2 tsp (25 mg)  - practice avoidance measures as outlined below when possible   Follow-up in 6 months, sooner if needed.  It was wonderful seeing you today! Thank you for letting me participate in your care. Sigurd Sos, MD [facility]

## 2022-08-12 ENCOUNTER — Ambulatory Visit (INDEPENDENT_AMBULATORY_CARE_PROVIDER_SITE_OTHER): Payer: Medicare Other

## 2022-08-12 DIAGNOSIS — J309 Allergic rhinitis, unspecified: Secondary | ICD-10-CM | POA: Diagnosis not present

## 2022-08-19 DIAGNOSIS — M069 Rheumatoid arthritis, unspecified: Secondary | ICD-10-CM | POA: Diagnosis not present

## 2022-08-19 DIAGNOSIS — M3509 Sicca syndrome with other organ involvement: Secondary | ICD-10-CM | POA: Diagnosis not present

## 2022-08-19 DIAGNOSIS — Z79899 Other long term (current) drug therapy: Secondary | ICD-10-CM | POA: Diagnosis not present

## 2022-08-20 ENCOUNTER — Ambulatory Visit (INDEPENDENT_AMBULATORY_CARE_PROVIDER_SITE_OTHER): Payer: Medicare Other | Admitting: *Deleted

## 2022-08-20 DIAGNOSIS — J309 Allergic rhinitis, unspecified: Secondary | ICD-10-CM

## 2022-08-25 ENCOUNTER — Other Ambulatory Visit: Payer: Self-pay | Admitting: Internal Medicine

## 2022-08-25 DIAGNOSIS — Z1231 Encounter for screening mammogram for malignant neoplasm of breast: Secondary | ICD-10-CM

## 2022-08-26 ENCOUNTER — Ambulatory Visit (INDEPENDENT_AMBULATORY_CARE_PROVIDER_SITE_OTHER): Payer: Medicare Other

## 2022-08-26 DIAGNOSIS — J309 Allergic rhinitis, unspecified: Secondary | ICD-10-CM

## 2022-09-03 ENCOUNTER — Ambulatory Visit (INDEPENDENT_AMBULATORY_CARE_PROVIDER_SITE_OTHER): Payer: Medicare Other

## 2022-09-03 DIAGNOSIS — J309 Allergic rhinitis, unspecified: Secondary | ICD-10-CM | POA: Diagnosis not present

## 2022-09-03 NOTE — Progress Notes (Signed)
VIALS EXP 09-04-23 

## 2022-09-04 DIAGNOSIS — J3089 Other allergic rhinitis: Secondary | ICD-10-CM | POA: Diagnosis not present

## 2022-09-05 DIAGNOSIS — J302 Other seasonal allergic rhinitis: Secondary | ICD-10-CM

## 2022-09-09 ENCOUNTER — Ambulatory Visit (INDEPENDENT_AMBULATORY_CARE_PROVIDER_SITE_OTHER): Payer: Medicare Other

## 2022-09-09 DIAGNOSIS — J309 Allergic rhinitis, unspecified: Secondary | ICD-10-CM

## 2022-09-18 ENCOUNTER — Ambulatory Visit (INDEPENDENT_AMBULATORY_CARE_PROVIDER_SITE_OTHER): Payer: Medicare Other

## 2022-09-18 DIAGNOSIS — J309 Allergic rhinitis, unspecified: Secondary | ICD-10-CM | POA: Diagnosis not present

## 2022-09-24 ENCOUNTER — Ambulatory Visit (INDEPENDENT_AMBULATORY_CARE_PROVIDER_SITE_OTHER): Payer: Medicare Other

## 2022-09-24 DIAGNOSIS — J309 Allergic rhinitis, unspecified: Secondary | ICD-10-CM | POA: Diagnosis not present

## 2022-09-25 DIAGNOSIS — G379 Demyelinating disease of central nervous system, unspecified: Secondary | ICD-10-CM | POA: Diagnosis not present

## 2022-09-25 DIAGNOSIS — M069 Rheumatoid arthritis, unspecified: Secondary | ICD-10-CM | POA: Diagnosis not present

## 2022-09-25 DIAGNOSIS — D508 Other iron deficiency anemias: Secondary | ICD-10-CM | POA: Diagnosis not present

## 2022-09-25 DIAGNOSIS — W19XXXA Unspecified fall, initial encounter: Secondary | ICD-10-CM | POA: Diagnosis not present

## 2022-09-25 DIAGNOSIS — J3089 Other allergic rhinitis: Secondary | ICD-10-CM | POA: Diagnosis not present

## 2022-09-25 DIAGNOSIS — S060X9A Concussion with loss of consciousness of unspecified duration, initial encounter: Secondary | ICD-10-CM | POA: Diagnosis not present

## 2022-10-01 ENCOUNTER — Ambulatory Visit (INDEPENDENT_AMBULATORY_CARE_PROVIDER_SITE_OTHER): Payer: Medicare Other

## 2022-10-01 DIAGNOSIS — J309 Allergic rhinitis, unspecified: Secondary | ICD-10-CM | POA: Diagnosis not present

## 2022-10-09 ENCOUNTER — Ambulatory Visit (INDEPENDENT_AMBULATORY_CARE_PROVIDER_SITE_OTHER): Payer: Medicare Other

## 2022-10-09 DIAGNOSIS — J309 Allergic rhinitis, unspecified: Secondary | ICD-10-CM | POA: Diagnosis not present

## 2022-10-15 ENCOUNTER — Ambulatory Visit: Payer: Medicare Other | Admitting: Internal Medicine

## 2022-10-15 ENCOUNTER — Ambulatory Visit (INDEPENDENT_AMBULATORY_CARE_PROVIDER_SITE_OTHER): Payer: Medicare Other

## 2022-10-15 DIAGNOSIS — J309 Allergic rhinitis, unspecified: Secondary | ICD-10-CM | POA: Diagnosis not present

## 2022-10-19 IMAGING — CT CT HEAD W/O CM
4 series · 15 of 47 positions shown, 17 images · non-contrast
Comparison: None.

CLINICAL DATA: Fall.

EXAM:
CT HEAD WITHOUT CONTRAST
TECHNIQUE: Contiguous axial images were obtained from the base of the skull
through the vertex without intravenous contrast.

[Series 2: head 5.00 hr40 s3 axial ibhc · axial · 0.43mm/px · z∈[-607,-492]mm · 7 of 31 slices shown, 9 images]
[im 4/31  brain]
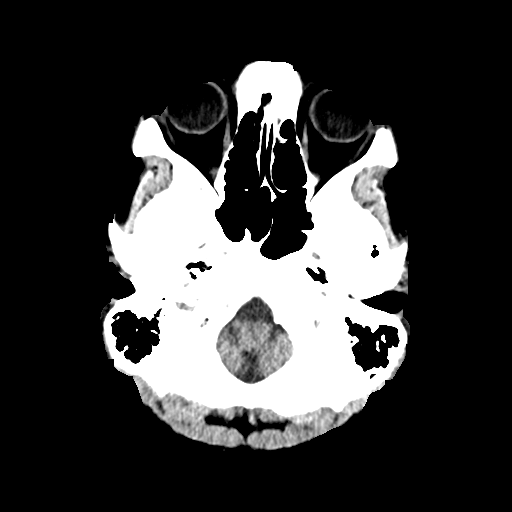
[im 4/31  bone]
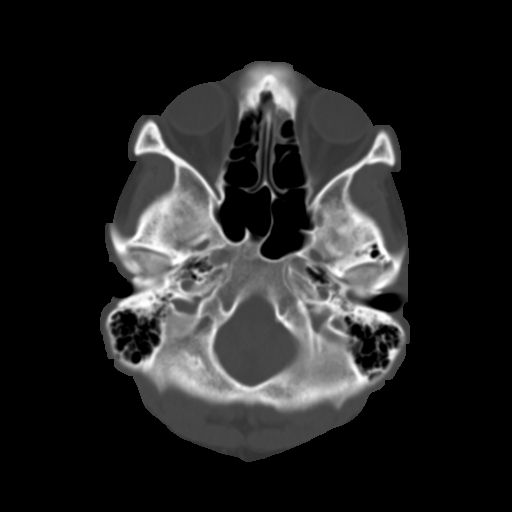
[im 8/31  brain]
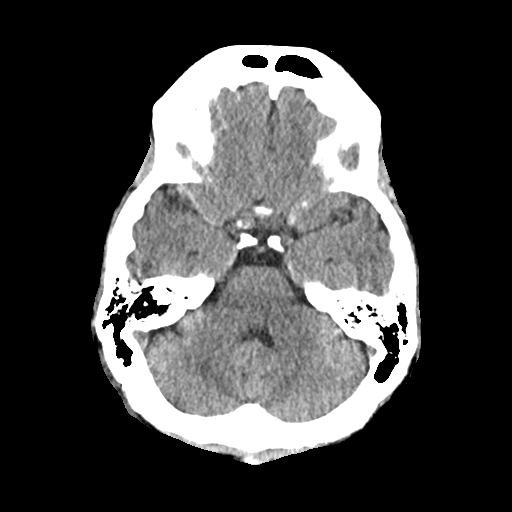
[im 12/31  brain]
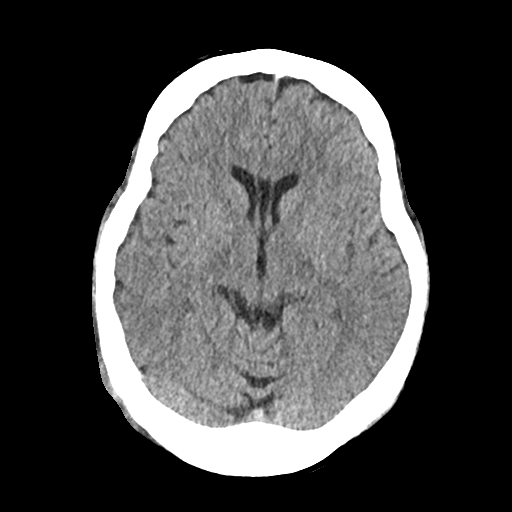
[im 16/31  brain]
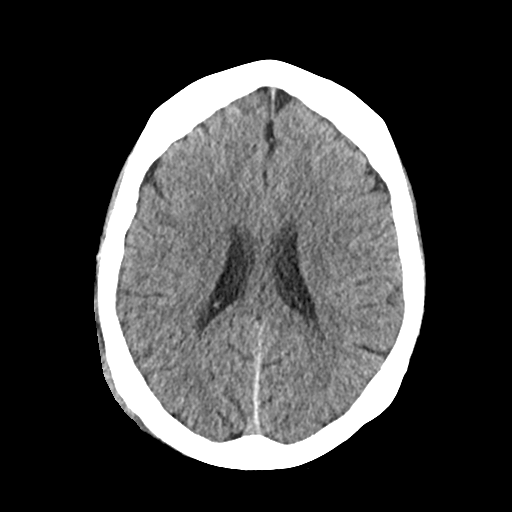
[im 19/31  brain]
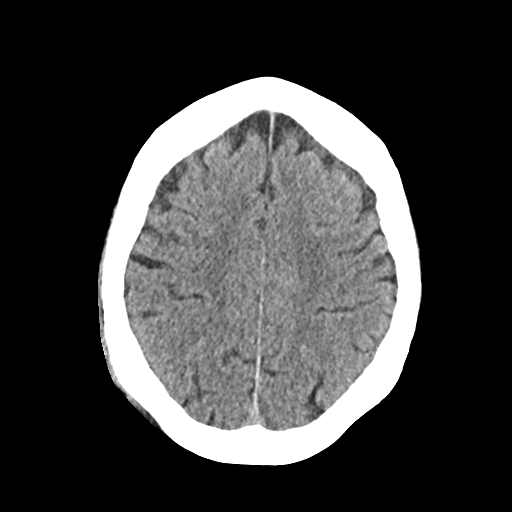
[im 19/31  bone]
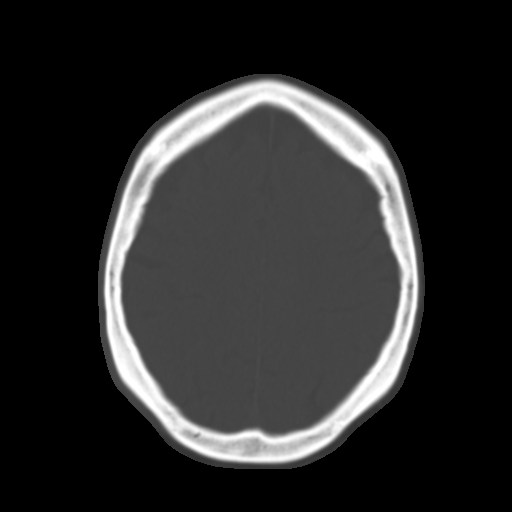
[im 23/31  brain]
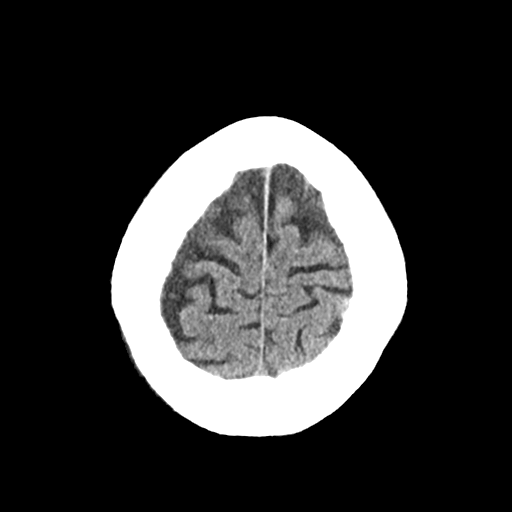
[im 27/31  brain]
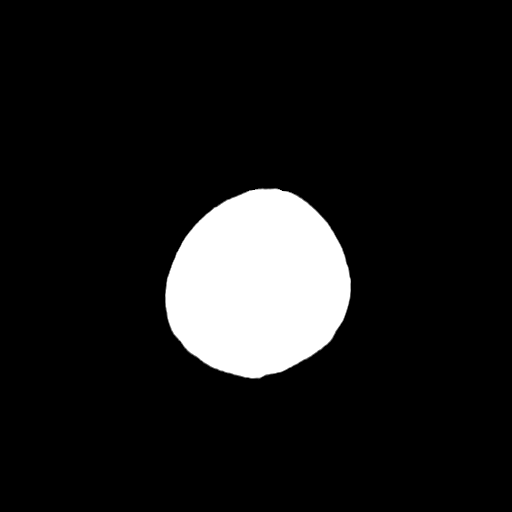

[Series 3: head 2.00 hr60 s3 axial bone · axial · 0.43mm/px · z∈[-609,-593]mm · 2 of 77 slices shown]
[im 8/77  bone]
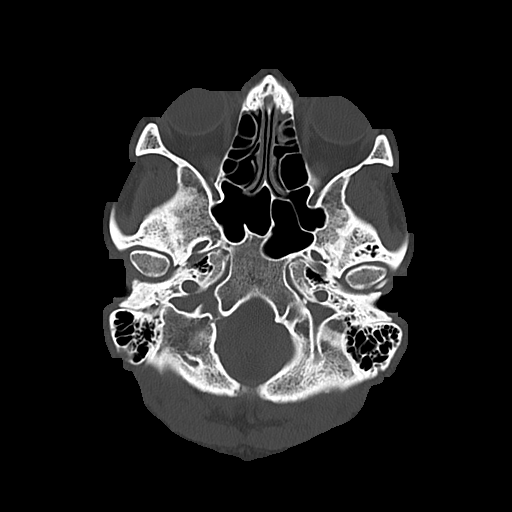
[im 16/77  bone]
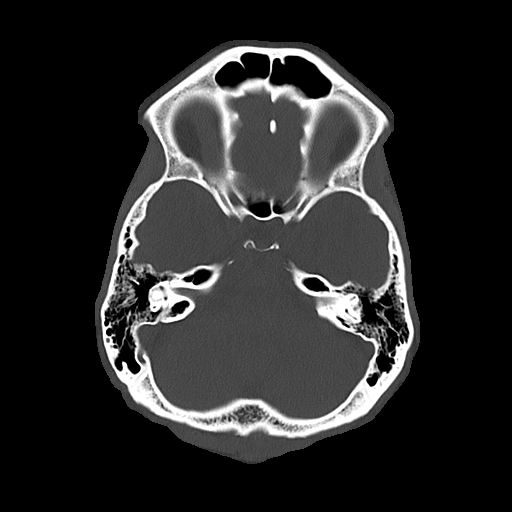

[Series 4: head 3.00 hr40 s3 sag · sagittal · 0.30mm/px · 3 of 73 slices shown]
[im 25/73  brain]
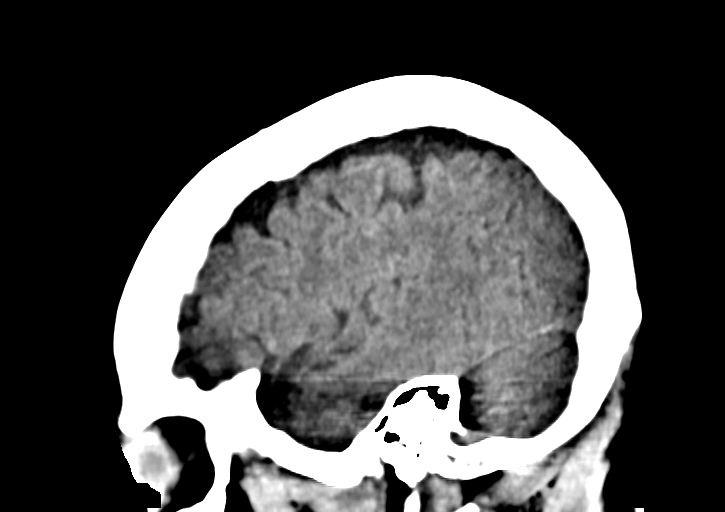
[im 37/73  brain]
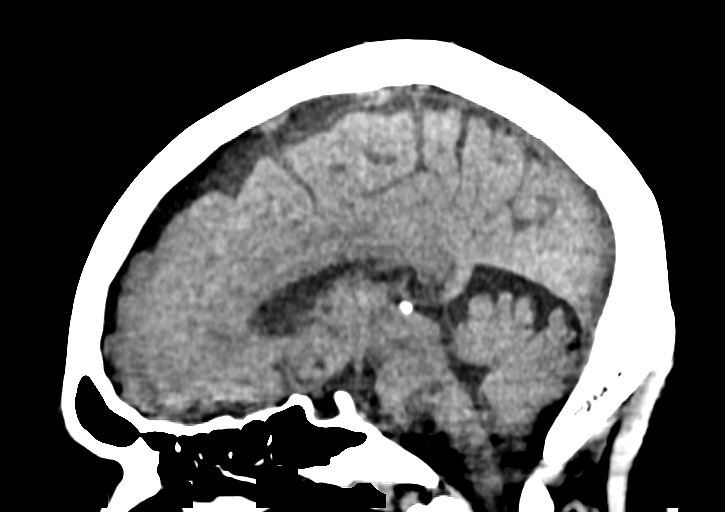
[im 49/73  brain]
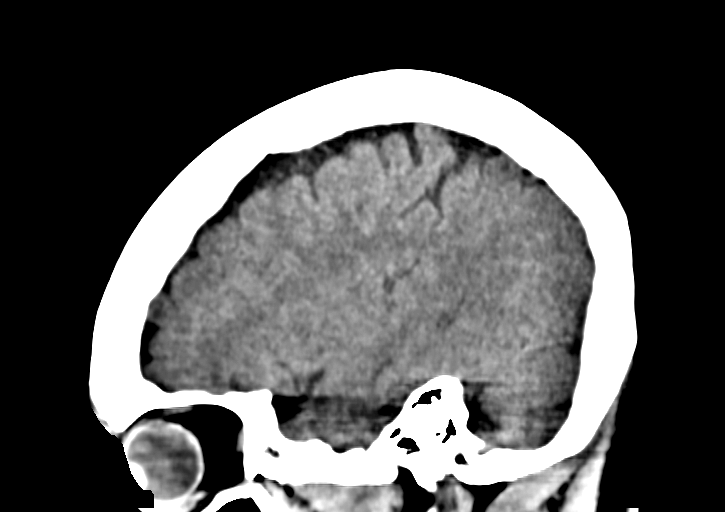

[Series 6: head 3.00 hr40 s3 cor · coronal · 0.30mm/px · 3 of 73 slices shown]
[im 25/73  brain]
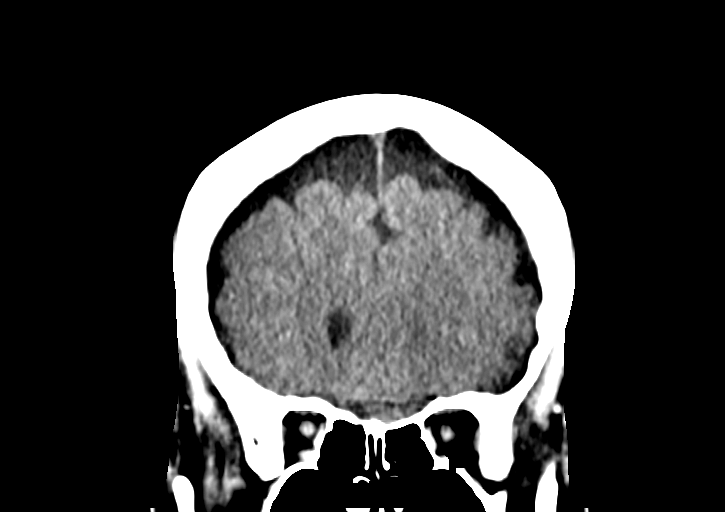
[im 33/73  brain]
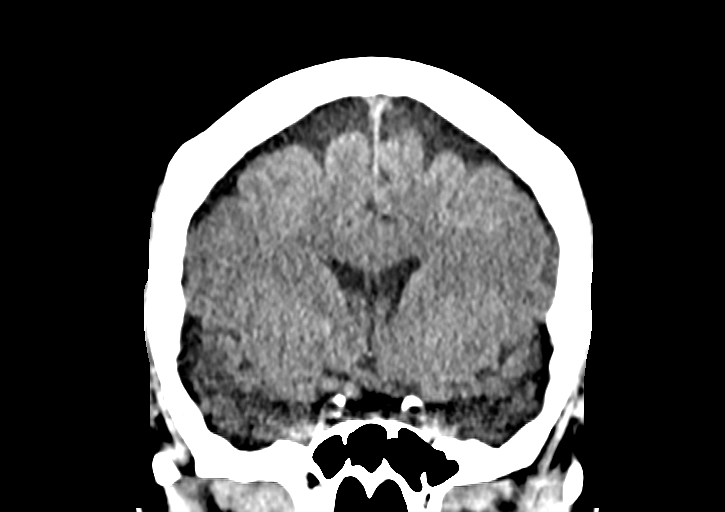
[im 41/73  brain]
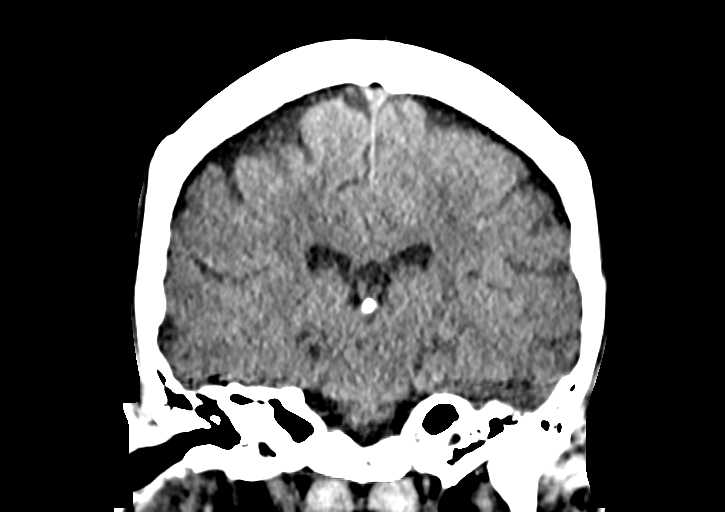

[15 of 47 positions shown; findings below may reference images not displayed]

FINDINGS: Brain: No evidence of acute infarction, hemorrhage, hydrocephalus,
extra-axial collection or mass lesion/mass effect.

Vascular: No hyperdense vessel or unexpected calcification.

Skull: Normal. Negative for fracture or focal lesion.

Sinuses/Orbits: No acute finding.

Other: None.
IMPRESSION: No acute intracranial abnormality seen.

## 2022-10-23 ENCOUNTER — Ambulatory Visit (INDEPENDENT_AMBULATORY_CARE_PROVIDER_SITE_OTHER): Payer: Medicare Other

## 2022-10-23 ENCOUNTER — Ambulatory Visit
Admission: RE | Admit: 2022-10-23 | Discharge: 2022-10-23 | Disposition: A | Discharge status: Home / Self Care | Payer: 59 | Source: Ambulatory Visit | Attending: Internal Medicine | Admitting: Internal Medicine

## 2022-10-23 DIAGNOSIS — Z1231 Encounter for screening mammogram for malignant neoplasm of breast: Secondary | ICD-10-CM | POA: Diagnosis not present

## 2022-10-23 DIAGNOSIS — J309 Allergic rhinitis, unspecified: Secondary | ICD-10-CM

## 2022-10-29 ENCOUNTER — Ambulatory Visit (INDEPENDENT_AMBULATORY_CARE_PROVIDER_SITE_OTHER): Payer: Medicare Other

## 2022-10-29 DIAGNOSIS — J309 Allergic rhinitis, unspecified: Secondary | ICD-10-CM | POA: Diagnosis not present

## 2022-11-03 DIAGNOSIS — M1612 Unilateral primary osteoarthritis, left hip: Secondary | ICD-10-CM | POA: Diagnosis not present

## 2022-11-03 DIAGNOSIS — M25552 Pain in left hip: Secondary | ICD-10-CM | POA: Diagnosis not present

## 2022-11-03 DIAGNOSIS — Z79899 Other long term (current) drug therapy: Secondary | ICD-10-CM | POA: Diagnosis not present

## 2022-11-03 DIAGNOSIS — E559 Vitamin D deficiency, unspecified: Secondary | ICD-10-CM | POA: Diagnosis not present

## 2022-11-03 DIAGNOSIS — Z96641 Presence of right artificial hip joint: Secondary | ICD-10-CM | POA: Diagnosis not present

## 2022-11-04 ENCOUNTER — Ambulatory Visit (INDEPENDENT_AMBULATORY_CARE_PROVIDER_SITE_OTHER): Payer: Medicare Other

## 2022-11-04 DIAGNOSIS — J309 Allergic rhinitis, unspecified: Secondary | ICD-10-CM | POA: Diagnosis not present

## 2022-11-12 ENCOUNTER — Ambulatory Visit (INDEPENDENT_AMBULATORY_CARE_PROVIDER_SITE_OTHER): Payer: Medicare Other

## 2022-11-12 DIAGNOSIS — J309 Allergic rhinitis, unspecified: Secondary | ICD-10-CM

## 2022-11-13 DIAGNOSIS — Z1211 Encounter for screening for malignant neoplasm of colon: Secondary | ICD-10-CM | POA: Diagnosis not present

## 2022-11-13 DIAGNOSIS — K5730 Diverticulosis of large intestine without perforation or abscess without bleeding: Secondary | ICD-10-CM | POA: Diagnosis not present

## 2022-11-13 DIAGNOSIS — K649 Unspecified hemorrhoids: Secondary | ICD-10-CM | POA: Diagnosis not present

## 2022-11-17 NOTE — Progress Notes (Signed)
VIALS EXP 11-18-23

## 2022-11-18 DIAGNOSIS — J3089 Other allergic rhinitis: Secondary | ICD-10-CM | POA: Diagnosis not present

## 2022-11-19 ENCOUNTER — Ambulatory Visit (INDEPENDENT_AMBULATORY_CARE_PROVIDER_SITE_OTHER): Payer: Medicare Other

## 2022-11-19 ENCOUNTER — Other Ambulatory Visit: Payer: Self-pay | Admitting: Internal Medicine

## 2022-11-19 DIAGNOSIS — J309 Allergic rhinitis, unspecified: Secondary | ICD-10-CM

## 2022-11-20 DIAGNOSIS — J302 Other seasonal allergic rhinitis: Secondary | ICD-10-CM | POA: Diagnosis not present

## 2022-11-22 IMAGING — MG MM DIGITAL SCREENING BILAT W/ TOMO AND CAD
8 series · 8 of 24 positions shown · non-contrast
Comparison: Previous exam(s).

CLINICAL DATA: Screening.

EXAM:
DIGITAL SCREENING BILATERAL MAMMOGRAM WITH TOMOSYNTHESIS AND CAD
TECHNIQUE: Bilateral screening digital craniocaudal and mediolateral oblique
mammograms were obtained. Bilateral screening digital breast
tomosynthesis was performed. The images were evaluated with
computer-aided detection.

[L MLO synth-2D]
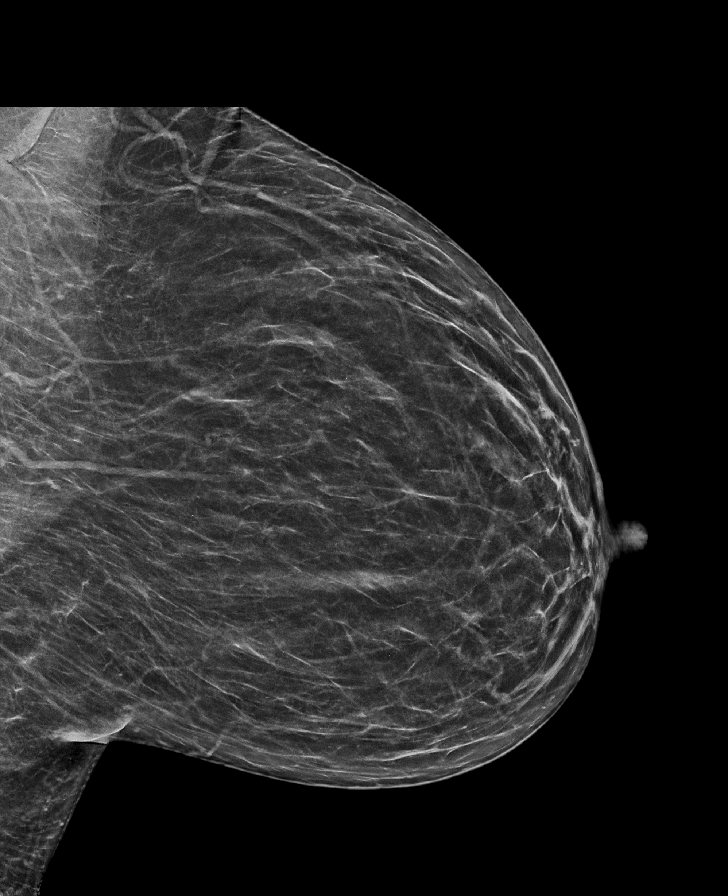

[R MLO synth-2D]
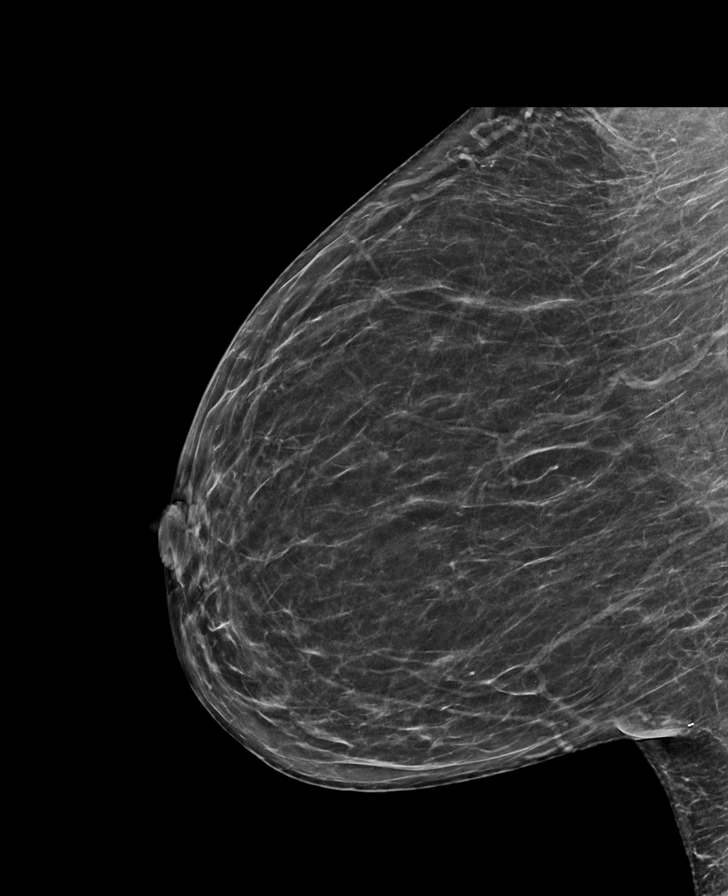

[L CC synth-2D]
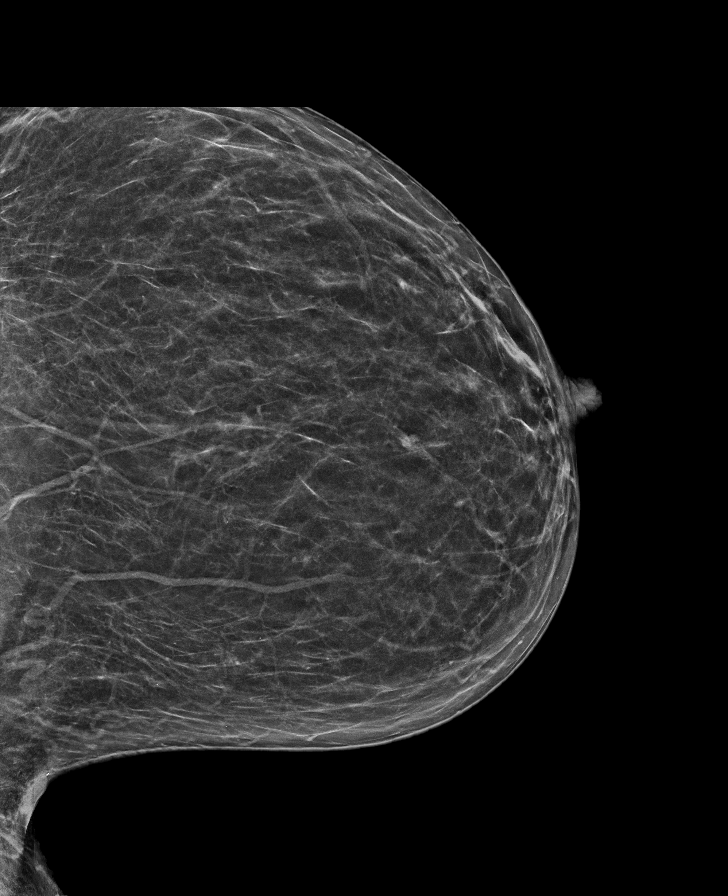

[R CC synth-2D]
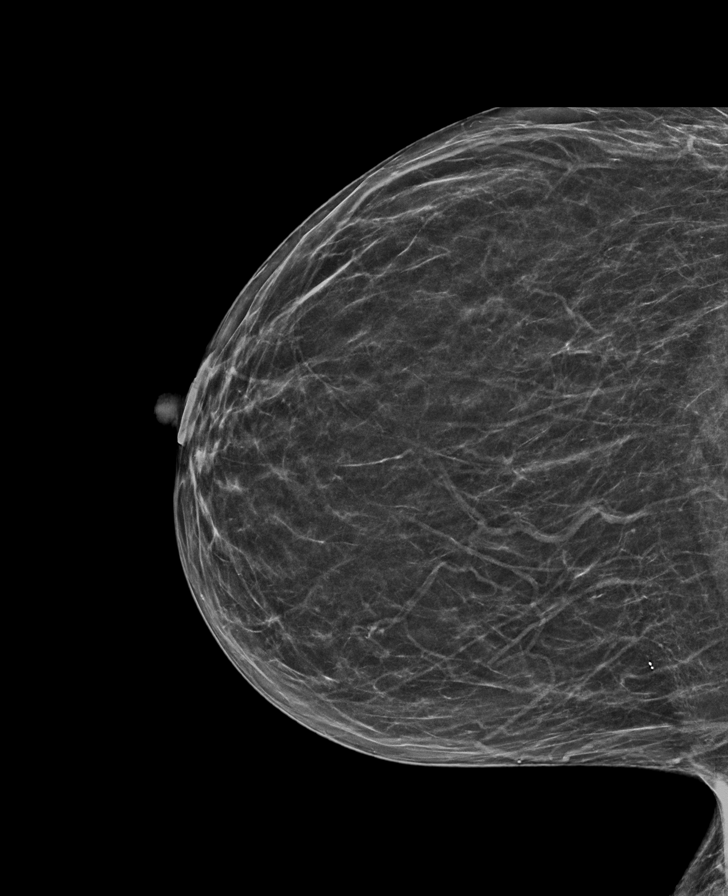

[R MLO tomo · tomo slice 35/68.0]
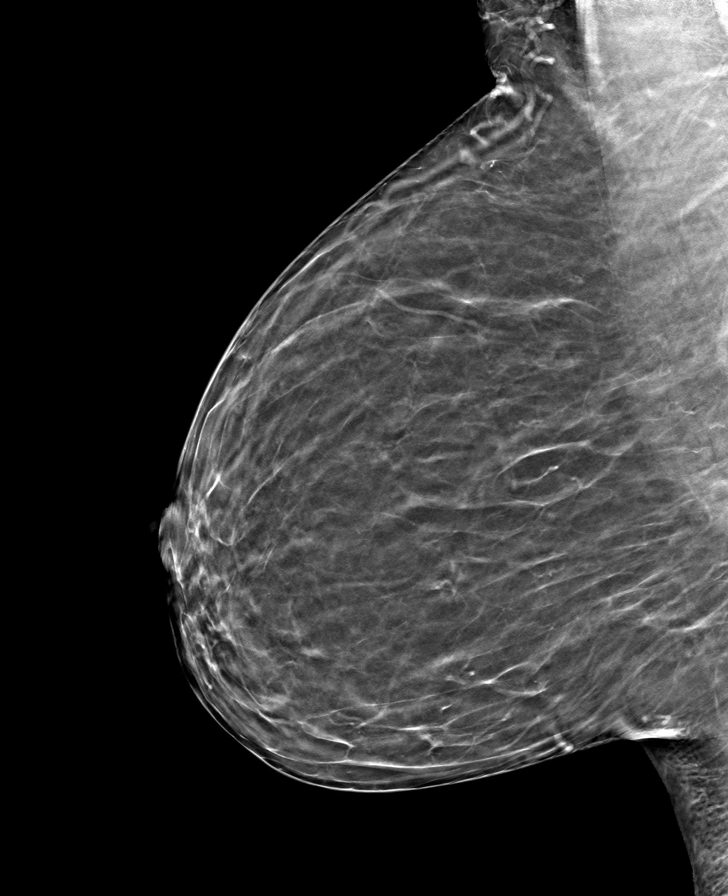

[L MLO tomo · tomo slice 35/68.0]
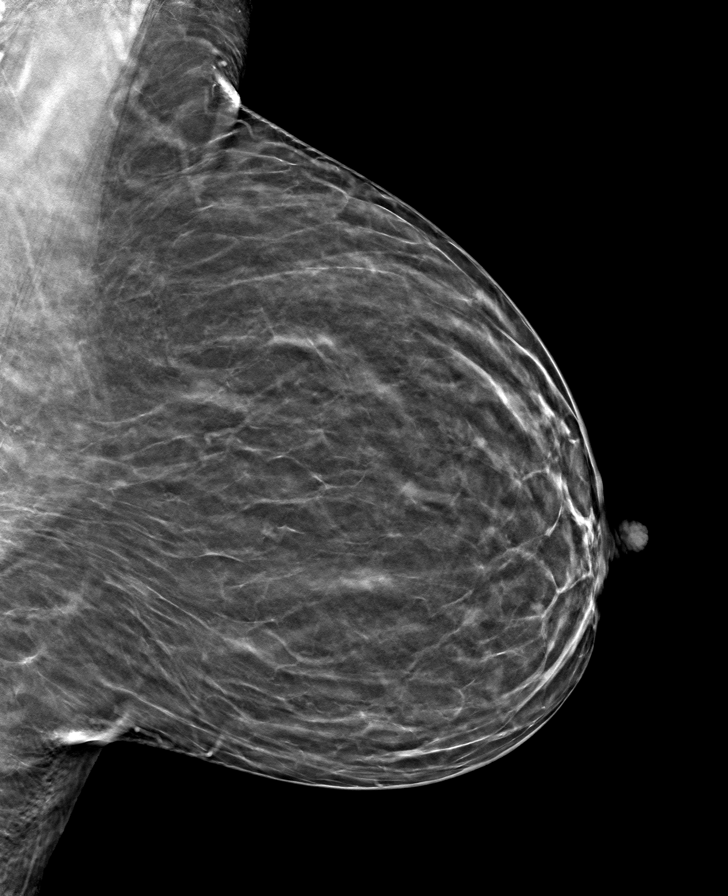

[R CC tomo · tomo slice 31/60.0]
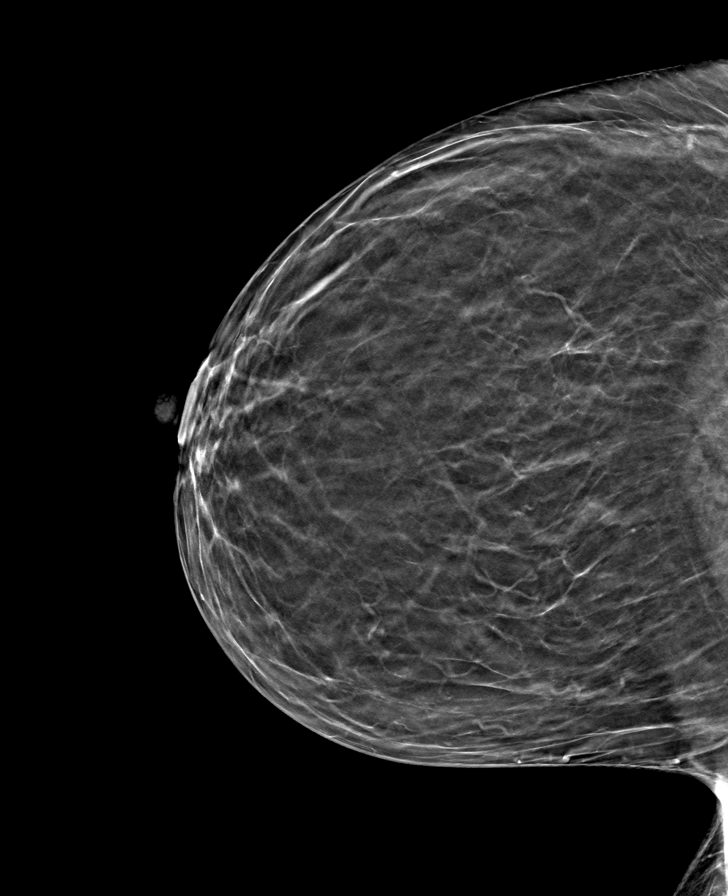

[L CC tomo · tomo slice 31/62.0]
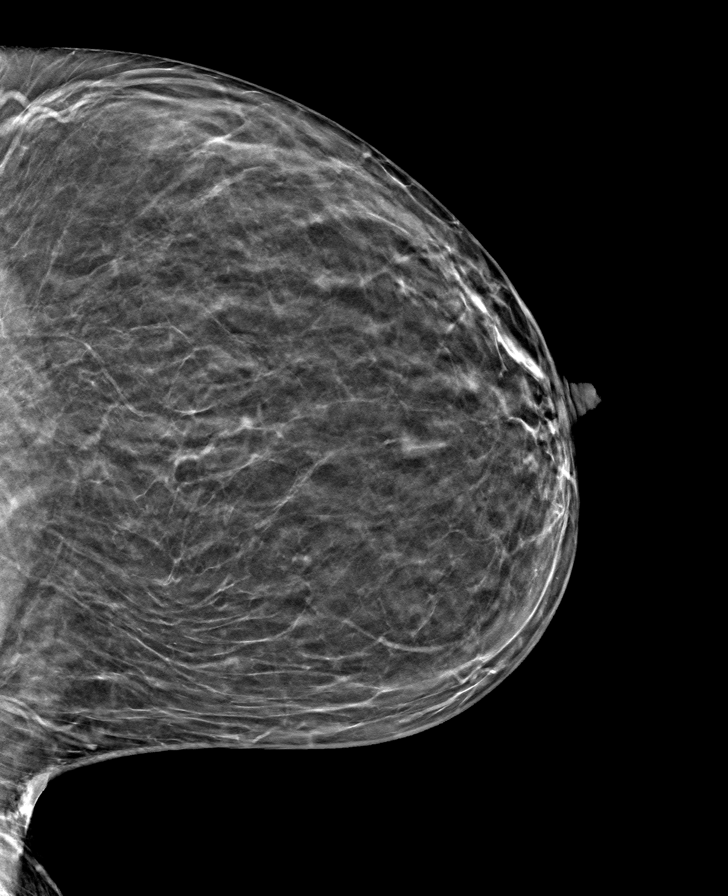

[8 of 24 positions shown; findings below may reference images not displayed]

ACR Breast Density Category b: There are scattered areas of
fibroglandular density.
FINDINGS: There are no findings suspicious for malignancy.
IMPRESSION: No mammographic evidence of malignancy. A result letter of this
screening mammogram will be mailed directly to the patient.

RECOMMENDATION:
Screening mammogram in one year. (Code:51-O-LD2)

BI-RADS CATEGORY  1: Negative.

## 2022-11-26 ENCOUNTER — Ambulatory Visit (INDEPENDENT_AMBULATORY_CARE_PROVIDER_SITE_OTHER): Payer: 59

## 2022-11-26 DIAGNOSIS — J309 Allergic rhinitis, unspecified: Secondary | ICD-10-CM

## 2022-12-02 ENCOUNTER — Telehealth: Payer: Self-pay | Admitting: *Deleted

## 2022-12-02 NOTE — Telephone Encounter (Signed)
Received a letter in the mail from insurance stating that the [place] is not on formulary and that preferred alternatives are Albuterol HFA, Ventolin, HFA, or Levalbuterol HFA. Please advise to change in inhaler.

## 2022-12-02 NOTE — Telephone Encounter (Signed)
Can we please do a PA for the ProAir Respiclick and include reason of Arthritis in hands for reason that she needs this inhaler please?

## 2022-12-03 ENCOUNTER — Ambulatory Visit (INDEPENDENT_AMBULATORY_CARE_PROVIDER_SITE_OTHER): Payer: 59

## 2022-12-03 DIAGNOSIS — J309 Allergic rhinitis, unspecified: Secondary | ICD-10-CM | POA: Diagnosis not present

## 2022-12-04 ENCOUNTER — Telehealth: Payer: Self-pay

## 2022-12-04 MED ORDER — ARNUITY ELLIPTA 100 MCG/ACT IN AEPB: 1.0000 | INHALATION_SPRAY | Freq: Two times a day (BID) | RESPIRATORY_TRACT | 5 refills | Status: DC

## 2022-12-04 NOTE — Telephone Encounter (Signed)
Arnuity 100 mcg 1 puff twice daily. She can not use HFA due to arthritis.

## 2022-12-04 NOTE — Telephone Encounter (Signed)
Ty, [phone], called in - DOB verified - stated Brand Flovent is no longer being made and Generic Fluticasone 220 mcg/act in not on formulary.  The following are the alternatives: Qvar and Arnuity  Forwarding message to provider for next step.

## 2022-12-04 NOTE — Telephone Encounter (Signed)
Arnuity 100 mcg 1 puff twice daily. With a note to the pharmacy that the patient can not use HFA due to her arthritis. Patient has been notified and had no further questions or concerns.

## 2022-12-04 NOTE — Addendum Note (Signed)
Addended by: Larence Penning on: 12/04/2022 04:38 PM   Modules accepted: Orders

## 2022-12-05 ENCOUNTER — Other Ambulatory Visit (HOSPITAL_COMMUNITY): Payer: Self-pay

## 2022-12-05 NOTE — Telephone Encounter (Signed)
PA has been APPROVED from 12/05/2022-10/06/2023  Key: [id number]

## 2022-12-08 ENCOUNTER — Telehealth: Payer: Self-pay | Admitting: Internal Medicine

## 2022-12-08 ENCOUNTER — Ambulatory Visit (INDEPENDENT_AMBULATORY_CARE_PROVIDER_SITE_OTHER): Payer: 59 | Admitting: *Deleted

## 2022-12-08 DIAGNOSIS — J309 Allergic rhinitis, unspecified: Secondary | ICD-10-CM | POA: Diagnosis not present

## 2022-12-08 NOTE — Telephone Encounter (Signed)
I called the pt. pharmacy and the [place] will be ready for pickup at 6pm today. Pharmacist was not sure why she was not able to pick up her inhaler when she came. Patient has been notified and plans to pick up inhaler today 12/08/22.

## 2022-12-08 NOTE — Telephone Encounter (Signed)
PT stated that Arnuity Prescription was sent over to the [facility]. PT stated Pharmacy sent it back to [place] office. Pt is not sure why.  Best contact: [phone]

## 2022-12-09 DIAGNOSIS — D649 Anemia, unspecified: Secondary | ICD-10-CM | POA: Diagnosis not present

## 2022-12-09 MED ORDER — ARNUITY ELLIPTA 200 MCG/ACT IN AEPB: 1.0000 | INHALATION_SPRAY | Freq: Every day | RESPIRATORY_TRACT | 5 refills | Status: DC

## 2022-12-09 NOTE — Telephone Encounter (Signed)
I called the pharmacy they got the new prescription and the pharmacy will call the patient when it is ready for pick up this time. I also notified the patient and she verbalized understanding of the change in dosage. Thank you

## 2022-12-09 NOTE — Telephone Encounter (Signed)
Hi Diandra-I made a mistake and it should be only once daily.  I am fine with her getting the Arnuity 200 mcg but only using 1 puff daily.  Thank you for following up on this!

## 2022-12-09 NOTE — Telephone Encounter (Signed)
I called the patient to make sure she was not able to get the Arnuity inhaler. Patient stated the pharmacy was getting the medication ready and told her the Arnuity 100 mcg 1 puff twice daily was a high does and was not covered by her insurance. They called the office and would like the switch the prescription to Arnuity 200 mcg 2 puffs once daily for it to be covered by insurance.   Please advise on change. I told the patient I will call her one I can get the medication filled and processed.  The pharmacist told me yesterday there was no issues with the prescription and it would be ready by 6pm, but had the patient waiting out in the parking lot after she left work just to leave with no medication.

## 2022-12-09 NOTE — Telephone Encounter (Signed)
[employer] called stating the 127mg 2 puffs a day for arnuity is not covered. There is arnuity 2060m available if prescription could be adjusted for 20011mone puff a day.

## 2022-12-09 NOTE — Addendum Note (Signed)
Addended by: Larence Penning on: 12/09/2022 06:05 PM   Modules accepted: Orders

## 2022-12-09 NOTE — Addendum Note (Signed)
Addended by: Larence Penning on: 12/09/2022 06:04 PM   Modules accepted: Orders

## 2022-12-09 NOTE — Telephone Encounter (Signed)
Thank you Diandra!

## 2022-12-10 ENCOUNTER — Other Ambulatory Visit (HOSPITAL_COMMUNITY): Payer: Self-pay

## 2022-12-10 ENCOUNTER — Telehealth: Payer: Self-pay

## 2022-12-10 NOTE — Telephone Encounter (Signed)
Patient Advocate Encounter   Received notification from [employer] Medicare Part D that prior authorization is required for Arnuity Ellipta 100MCG/ACT aerosol powder  Submitted: n/a Key [id number]      This medication is on patient formulary and does not require a prior authorization.

## 2022-12-16 DIAGNOSIS — M1612 Unilateral primary osteoarthritis, left hip: Secondary | ICD-10-CM | POA: Diagnosis not present

## 2022-12-16 DIAGNOSIS — Z01818 Encounter for other preprocedural examination: Secondary | ICD-10-CM | POA: Diagnosis not present

## 2022-12-16 DIAGNOSIS — G35 Multiple sclerosis: Secondary | ICD-10-CM | POA: Diagnosis not present

## 2022-12-16 DIAGNOSIS — D508 Other iron deficiency anemias: Secondary | ICD-10-CM | POA: Diagnosis not present

## 2022-12-16 DIAGNOSIS — Z96641 Presence of right artificial hip joint: Secondary | ICD-10-CM | POA: Diagnosis not present

## 2022-12-16 DIAGNOSIS — M3500 Sicca syndrome, unspecified: Secondary | ICD-10-CM | POA: Diagnosis not present

## 2022-12-17 ENCOUNTER — Ambulatory Visit (INDEPENDENT_AMBULATORY_CARE_PROVIDER_SITE_OTHER): Payer: 59

## 2022-12-17 DIAGNOSIS — J309 Allergic rhinitis, unspecified: Secondary | ICD-10-CM | POA: Diagnosis not present

## 2022-12-18 DIAGNOSIS — D508 Other iron deficiency anemias: Secondary | ICD-10-CM | POA: Diagnosis not present

## 2022-12-22 DIAGNOSIS — D638 Anemia in other chronic diseases classified elsewhere: Secondary | ICD-10-CM | POA: Diagnosis not present

## 2022-12-22 DIAGNOSIS — D649 Anemia, unspecified: Secondary | ICD-10-CM | POA: Diagnosis not present

## 2022-12-22 DIAGNOSIS — G35 Multiple sclerosis: Secondary | ICD-10-CM | POA: Diagnosis not present

## 2022-12-22 DIAGNOSIS — D508 Other iron deficiency anemias: Secondary | ICD-10-CM | POA: Diagnosis not present

## 2022-12-23 DIAGNOSIS — I6200 Nontraumatic subdural hemorrhage, unspecified: Secondary | ICD-10-CM | POA: Diagnosis not present

## 2022-12-23 DIAGNOSIS — G35 Multiple sclerosis: Secondary | ICD-10-CM | POA: Diagnosis not present

## 2022-12-24 ENCOUNTER — Ambulatory Visit (INDEPENDENT_AMBULATORY_CARE_PROVIDER_SITE_OTHER): Payer: 59

## 2022-12-24 DIAGNOSIS — J309 Allergic rhinitis, unspecified: Secondary | ICD-10-CM | POA: Diagnosis not present

## 2022-12-25 DIAGNOSIS — Z96643 Presence of artificial hip joint, bilateral: Secondary | ICD-10-CM | POA: Diagnosis not present

## 2022-12-25 DIAGNOSIS — D509 Iron deficiency anemia, unspecified: Secondary | ICD-10-CM | POA: Diagnosis not present

## 2022-12-25 DIAGNOSIS — Z9889 Other specified postprocedural states: Secondary | ICD-10-CM | POA: Diagnosis not present

## 2022-12-25 DIAGNOSIS — M1612 Unilateral primary osteoarthritis, left hip: Secondary | ICD-10-CM | POA: Diagnosis not present

## 2022-12-25 DIAGNOSIS — R937 Abnormal findings on diagnostic imaging of other parts of musculoskeletal system: Secondary | ICD-10-CM | POA: Diagnosis not present

## 2022-12-25 DIAGNOSIS — I878 Other specified disorders of veins: Secondary | ICD-10-CM | POA: Diagnosis not present

## 2022-12-25 DIAGNOSIS — M47816 Spondylosis without myelopathy or radiculopathy, lumbar region: Secondary | ICD-10-CM | POA: Diagnosis not present

## 2022-12-26 DIAGNOSIS — M1612 Unilateral primary osteoarthritis, left hip: Secondary | ICD-10-CM | POA: Diagnosis not present

## 2022-12-26 DIAGNOSIS — D509 Iron deficiency anemia, unspecified: Secondary | ICD-10-CM | POA: Diagnosis not present

## 2022-12-27 DIAGNOSIS — D509 Iron deficiency anemia, unspecified: Secondary | ICD-10-CM | POA: Diagnosis not present

## 2022-12-27 DIAGNOSIS — M1612 Unilateral primary osteoarthritis, left hip: Secondary | ICD-10-CM | POA: Diagnosis not present

## 2022-12-30 ENCOUNTER — Ambulatory Visit (INDEPENDENT_AMBULATORY_CARE_PROVIDER_SITE_OTHER): Payer: 59

## 2022-12-30 DIAGNOSIS — J309 Allergic rhinitis, unspecified: Secondary | ICD-10-CM | POA: Diagnosis not present

## 2023-01-06 ENCOUNTER — Ambulatory Visit (INDEPENDENT_AMBULATORY_CARE_PROVIDER_SITE_OTHER): Payer: 59

## 2023-01-06 DIAGNOSIS — J309 Allergic rhinitis, unspecified: Secondary | ICD-10-CM

## 2023-01-20 ENCOUNTER — Ambulatory Visit (INDEPENDENT_AMBULATORY_CARE_PROVIDER_SITE_OTHER): Payer: 59

## 2023-01-20 DIAGNOSIS — J309 Allergic rhinitis, unspecified: Secondary | ICD-10-CM

## 2023-02-03 ENCOUNTER — Ambulatory Visit (INDEPENDENT_AMBULATORY_CARE_PROVIDER_SITE_OTHER): Payer: 59

## 2023-02-03 DIAGNOSIS — J309 Allergic rhinitis, unspecified: Secondary | ICD-10-CM | POA: Diagnosis not present

## 2023-02-04 ENCOUNTER — Ambulatory Visit: Payer: 59 | Admitting: Internal Medicine

## 2023-02-05 DIAGNOSIS — G35 Multiple sclerosis: Secondary | ICD-10-CM | POA: Diagnosis not present

## 2023-02-10 NOTE — Progress Notes (Unsigned)
FOLLOW UP Date of Service/Encounter:  02/11/23   Subjective:  Sara Hayes (DOB: 14-Nov-1970) is a 52 y.o. female who [facility] in re-evaluation of the following: Asthma, allergic rhinitis, history of venom allergy  History obtained from: chart review and patient.  For Review, LV was on 08/06/22  with Dr.Lailyn Appelbaum seen for routine follow-up. See below for summary of history and diagnostics.  Therapeutic plans/changes recommended: Doing well, recovering from respiratory illness. Needing inhalers switched to discus or similar due to arthritis (proair respiclick)  Pertinent History/Diagnostics:  Asthma:  Moderate persistent.  No history of hospitalizations, no steroids in the past year.  Uses rescue inhaler around once a week. Has a difficult time using HFA inhalers due to arthritis. -  spirometry (01/08/2022): ratio 102%, 87% FEV1 -Prior PFTs or spirometry: 2015-normal pulmonary function test with ratio of 90% and FEV1 of 101%- - Most Recent AEC (10/14/21): 200 Allergic Rhinitis:  Moved from [place] to [place] in 2012.  Uncontrolled with daily antihistamine, Flonase, and Singulair daily.Uses restasis for dry eyes.  Follows with ophthalmologist.  Last appointment was 2022 in the Spring.  Started AIT 02/11/2022.  Receiving Vial 1 (grass, dust mite, cat) vial 2 (cockroach and molds). Reached maintenance dose of 0.5 mL red vial on 08/20/22. - SPT environmental panel (01/08/2022): positive to cats, intradermals positive to [place] grass, mold mix 1, 2, 3 and 4, cockroach, mite mix Hymenoptera allergy   - Hx of reaction: Developed a local reaction but then became lightheaded, no syncope, but did require emergency room treatment. -IgE negative to honeybee, white hornet, yellow jacket, paper wasp, yellow hornet, bumblebee, tryptase 3.5 (normal baseline).   - skin testing 04/22/22: negative to honeybee, yellow jacket, yellow hornet, white hornet, and  wasp Penicillin allergy:  Broke out in hives when younger, no other details available for review.  Testing followed by challenge if indicated offered. Other: has Multiple sclerosis  Today presents for follow-up. Asthma is doing well. She is on her Arnuity 200 daily. She uses proair respiclick prior to exercise. She walks and rides a bike. She recently had her hip replacement on the left (prior right replacement over a year ago). She has not had any steroids or antibiotics for respiratory illness since last visit. Her allergies are improved on AIT. She is tolerating her injections well. She is due for an injection today. She still continues to have significant symptoms around cats, and one of the outdoor cats near her home recently had kittens and left them under her home. She can tell when she steps outside that her nose starts running and she feels her voice gets hoarse-like.  She uses flonase 1-2 times per day when this happens, takes allegra daily. She has used azelastine in the past, but isn't using currently. Has not used nasal saline rinses/spray. Continues using singulair daily.   Allergies as of 02/11/2023       Reactions   Amoxicillin Hives   Penicillins Hives   Has patient had a PCN reaction causing immediate rash, facial/tongue/throat swelling, SOB or lightheadedness with hypotension: YES Has patient had a PCN reaction causing severe rash involving mucus membranes or skin necrosis: yes Has patient had a PCN reaction that required hospitalization: no Has patient had a PCN reaction occurring within the last 10 years: no If all of the above answers are "NO", then may proceed with Cephalosporin use.   Aspirin Hives   Latex Hives, Other (See Comments)   Reaction  Type: Allergy; Severity: Moderate; Reaction(s): reddened skin   Naproxen Other (See Comments)   Unknown, large blood clots   Niacin And Related Other (See Comments)   flushing        Medication List        Accurate  as of Feb 11, 2023  1:27 PM. If you have any questions, ask your nurse or doctor.          STOP taking these medications    Aubagio 14 MG Tabs Generic drug: Teriflunomide Stopped by: Verlee Monte, MD   hydroxychloroquine 200 MG tablet Commonly known as: PLAQUENIL Stopped by: Verlee Monte, MD       TAKE these medications    Arnuity Ellipta 200 MCG/ACT Aepb Generic drug: Fluticasone Furoate Inhale 1 puff into the lungs daily.   ascorbic acid 500 MG tablet Commonly known as: VITAMIN C Take 500 mg by mouth daily.   azelastine 0.1 % nasal spray Commonly known as: ASTELIN Place 2 sprays into both nostrils 2 (two) times daily as needed for rhinitis. Use in each nostril as directed   calcium carbonate 500 MG chewable tablet Commonly known as: TUMS - dosed in mg elemental calcium Chew 1 tablet by mouth daily.   clonazePAM 0.5 MG tablet Commonly known as: KLONOPIN Take 0.5 mg by mouth 3 (three) times daily.   cycloSPORINE 0.05 % ophthalmic emulsion Commonly known as: RESTASIS Place 1 drop into both eyes 2 (two) times daily.   EpiPen 2-Pak 0.3 mg/0.3 mL Soaj injection Generic drug: EPINEPHrine Inject 0.3 mg into the muscle as needed for anaphylaxis.   ferrous sulfate 325 (65 FE) MG EC tablet Take 325 mg by mouth 3 (three) times daily with meals.   fluticasone 50 MCG/ACT nasal spray Commonly known as: FLONASE Place into both nostrils daily.   folic acid 1 MG tablet Commonly known as: FOLVITE Take 1 mg by mouth daily.   gabapentin 300 MG capsule Commonly known as: NEURONTIN Take 900-1,200 mg by mouth 3 (three) times daily. 900 mg in am and midday, and 1200 mg at night   hydrocortisone 2.5 % ointment Apply topically twice daily as need to red sandpapery rash.   levocetirizine 5 MG tablet Commonly known as: XYZAL Take 1 tablet (5 mg total) by mouth every evening.   levonorgestrel 20 MCG/DAY Iud Commonly known as: MIRENA 1 each by Intrauterine route once.    Mirena (52 MG) 20 MCG/DAY Iud Generic drug: levonorgestrel Place 1 application. vaginally See admin instructions.   methocarbamol 500 MG tablet Commonly known as: ROBAXIN Take 500 mg by mouth 4 (four) times daily.   montelukast 10 MG tablet Commonly known as: SINGULAIR Take 1 tablet (10 mg total) by mouth at bedtime.   MULTIVITAMIN GUMMIES ADULT PO Take by mouth.   nortriptyline 75 MG capsule Commonly known as: PAMELOR Take 75 mg by mouth at bedtime.   Olopatadine HCl 0.2 % Soln Apply 1 drop to eye daily as needed.   oxyCODONE 5 MG immediate release tablet Commonly known as: Oxy IR/ROXICODONE Take 5 mg by mouth 4 (four) times daily as needed for breakthrough pain.   pilocarpine 5 MG tablet Commonly known as: SALAGEN Take 5 mg by mouth 3 (three) times daily as needed (dry mouth).   PreviDent 5000 Booster Plus 1.1 % Pste Generic drug: Sodium Fluoride Place onto teeth 2 (two) times daily.   ProAir RespiClick 108 (90 Base) MCG/ACT Aepb Generic drug: Albuterol Sulfate INHALE 1 TO 2 PUFFS BY MOUTH 4  TIMES DAILY AS NEEDED (AND  15-20  MINUTES  PRIOR  TO  EXERCISE)   TYLENOL ARTHRITIS PAIN PO Take by mouth.   Vitamin D 50 MCG (2000 UT) Caps Take by mouth.   zinc sulfate 220 (50 Zn) MG capsule Take 220 mg by mouth daily.       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 83   Asthma    Biliary colic    Chronic pain syndrome    Constipation    Delayed menses    Demyelinating disease of central nervous system, unspecified (HCC)    Gallstone    History of total right hip replacement    Hypothyroidism    Idiopathic peripheral neuropathy    Iron deficiency anemia due to dietary causes    MS (multiple sclerosis) (HCC)    december 2018 ; managed by [facility] [place]    Myoclonus    Neuropathy    Ovarian cyst    Pituitary tumor    Primary osteoarthritis of hip, unspecified laterality    Rheumatic fever    age 52    Rheumatoid arthritis (HCC)    Seasonal allergic rhinitis due to pollen    Sickle cell trait (HCC)    Sjogren's disease (HCC)    Spotting    Thrombophilia (HCC)    Thyroid disease    11/2016 no longer taking med   Urinary incontinence    Past Surgical History:  Procedure Laterality Date   CHOLECYSTECTOMY N/A 06/11/2018   Procedure: LAPAROSCOPIC CHOLECYSTECTOMY;  Surgeon: Kinsinger, De Blanch, MD;  Location: [facility];  Service: General;  Laterality: N/A;   TUBAL LIGATION     Otherwise, there have been no changes to her past medical history, surgical history, family history, or social history.  ROS: All others negative except as noted per HPI.   Objective:  BP 110/78 (BP Location: Right Arm, Patient Position: Sitting, Cuff Size: Normal)   Pulse 78   Temp (!) 97 F (36.1 C) (Temporal)   Resp 16   Ht 5\' 6"  (1.676 m)   Wt 195 lb (88.5 kg)   SpO2 99%   BMI 31.47 kg/m  Body mass index is 31.47 kg/m. Physical Exam: General Appearance:  Alert, cooperative, no distress, appears stated age  Head:  Normocephalic, without obvious abnormality, atraumatic  Eyes:  Conjunctiva clear, EOM's intact  Nose: Nares normal, hypertrophic turbinates, normal mucosa, and no visible anterior polyps  Throat: Lips, tongue normal; teeth and gums normal, normal posterior oropharynx  Neck: Supple, symmetrical  Lungs:   clear to auscultation bilaterally, Respirations unlabored, no coughing  Heart:  regular rate and rhythm and no murmur, Appears well perfused  Extremities: No edema  Skin: Skin color, texture, turgor normal and no rashes or lesions on visualized portions of skin  Neurologic: No gross deficits   Spirometry:  Tracings reviewed. Her effort: Good reproducible efforts. FVC: 2.74L FEV1: 2.33L, 95% predicted FEV1/FVC ratio: 0.85 Interpretation: Spirometry consistent with normal pattern.  Please see scanned spirometry results for details.  Assessment/Plan   Moderate Persistent Asthma:  controlled - Controller Inhaler: Continue Arnuity 200 diskus, 1 puff once a day; This Should Be Used Everyday - Rinse mouth out after use - Rescue Inhaler: Albuterol (Proair/Ventolin) 2 puffs . Use  every 4-6 hours as needed for chest tightness, wheezing, or coughing.  Can also use 15 minutes prior to exercise if you have symptoms with activity. - Asthma is not controlled  if:  - Symptoms are occurring >2 times a week OR  - >2 times a month nighttime awakenings  - You are requiring systemic steroids (prednisone/steroid injections) more than once per year  - Your require hospitalization for your asthma.  - Please call the clinic to schedule a follow up if these symptoms arise  Perennial and seasonal allergic rhinitis: partially controlled - allergen avoidance towards cats, [place] grass,  indoor/outdoor molds, cockroach, mite mix - continue allergy injections as per protocol, continue to bring epinephrine auto-injector to your appointment Prior to medicated nasal sprays, use nasal rinse or nasal spray (Simply Saline, etc) to moisten and loosen mucus. - Continue  Flonase (fluticasone) 1- 2 sprays in each nostril daily (can buy over-the-counter if not covered by insurance)  Best results if used daily. - Continue Astelin (Azelastine) 1-2 sprays in each nostril twice a day as needed.  You may use this as needed for nasal congestion/itchy ears/itchy nose if desired - Continue Singulair (Montelukast) 10mg  nightly. - Continue over the counter antihistamine daily or daily as need ed.  Can increase to twice daily on days when hives occur. -Your options include Zyrtec (Cetirizine) 10mg , Claritin (Loratadine) 10mg , Allegra (Fexofenadine) 180mg , or Xyzal (Levocetirinze) 5mg  -AIT injection given today   Allergic Conjunctivitis: controlled - Consider Allergy Eye drops: great options include Pataday (Olopatadine) or Zaditor (ketotifen) for eye symptoms daily as needed-both sold over the counter if not covered  by insurance.   -Avoid eye drops that say red eye relief as they may contain medications that dry out your eyes.  Concern For Stinging Insect Allergy: stable Skin testing and blood work negative to honeybee, yellow jacket, yellow hornet, white hornet, and wasp -based on history, would continue to carry epipen in case of repeat reaction - for SKIN only, can take Benadryl 2 tsp (25 mg)  - practice avoidance measures as outlined below when possible  Follow-up in 6 months, sooner if needed.  It was wonderful seeing you today! Thank you for letting me participate in your care.  Tonny Bollman, MD  [facility]

## 2023-02-11 ENCOUNTER — Ambulatory Visit: Payer: Self-pay

## 2023-02-11 ENCOUNTER — Ambulatory Visit (INDEPENDENT_AMBULATORY_CARE_PROVIDER_SITE_OTHER): Payer: 59 | Admitting: Internal Medicine

## 2023-02-11 ENCOUNTER — Encounter: Payer: Self-pay | Admitting: Internal Medicine

## 2023-02-11 ENCOUNTER — Other Ambulatory Visit: Payer: Self-pay

## 2023-02-11 VITALS — BP 110/78 | HR 78 | Temp 97.0°F | Resp 16 | Ht 66.0 in | Wt 195.0 lb

## 2023-02-11 DIAGNOSIS — Z880 Allergy status to penicillin: Secondary | ICD-10-CM | POA: Diagnosis not present

## 2023-02-11 DIAGNOSIS — J302 Other seasonal allergic rhinitis: Secondary | ICD-10-CM

## 2023-02-11 DIAGNOSIS — J4540 Moderate persistent asthma, uncomplicated: Secondary | ICD-10-CM | POA: Diagnosis not present

## 2023-02-11 DIAGNOSIS — H1013 Acute atopic conjunctivitis, bilateral: Secondary | ICD-10-CM

## 2023-02-11 DIAGNOSIS — Z91038 Other insect allergy status: Secondary | ICD-10-CM

## 2023-02-11 DIAGNOSIS — J309 Allergic rhinitis, unspecified: Secondary | ICD-10-CM

## 2023-02-11 MED ORDER — ARNUITY ELLIPTA 200 MCG/ACT IN AEPB: 1.0000 | INHALATION_SPRAY | Freq: Every day | RESPIRATORY_TRACT | 5 refills | Status: DC

## 2023-02-11 MED ORDER — MONTELUKAST SODIUM 10 MG PO TABS: 10.0000 mg | ORAL_TABLET | Freq: Every day | ORAL | 5 refills | Status: DC

## 2023-02-11 MED ORDER — AZELASTINE HCL 0.1 % NA SOLN: 2.0000 | Freq: Two times a day (BID) | NASAL | 12 refills | Status: DC | PRN

## 2023-02-11 MED ORDER — OLOPATADINE HCL 0.2 % OP SOLN: 1.0000 [drp] | Freq: Every day | OPHTHALMIC | 5 refills | Status: DC | PRN

## 2023-02-11 MED ORDER — EPIPEN 2-PAK 0.3 MG/0.3ML IJ SOAJ: 0.3000 mg | INTRAMUSCULAR | 1 refills | Status: DC | PRN

## 2023-02-11 MED ORDER — PROAIR RESPICLICK 108 (90 BASE) MCG/ACT IN AEPB: INHALATION_SPRAY | RESPIRATORY_TRACT | 2 refills | Status: DC

## 2023-02-11 NOTE — Patient Instructions (Addendum)
Moderate Persistent Asthma: - Controller Inhaler: Continue Arnuity 200 diskus, 1 puff once a day; This Should Be Used Everyday - Rinse mouth out after use - Rescue Inhaler: Albuterol (Proair/Ventolin) 2 puffs . Use  every 4-6 hours as needed for chest tightness, wheezing, or coughing.  Can also use 15 minutes prior to exercise if you have symptoms with activity. - Asthma is not controlled if:  - Symptoms are occurring >2 times a week OR  - >2 times a month nighttime awakenings  - You are requiring systemic steroids (prednisone/steroid injections) more than once per year  - Your require hospitalization for your asthma.  - Please call the clinic to schedule a follow up if these symptoms arise  Perennial and seasonal allergic rhinitis: - allergen avoidance towards cats, [place] grass, indoor/outdoor molds, cockroach, mite mix - continue allergy injections as per protocol, continue to bring epinephrine auto-injector to your appointment Prior to medicated nasal sprays, use nasal rinse or nasal spray (Simply Saline, etc) to moisten and loosen mucus. - Continue  Flonase (fluticasone) 1- 2 sprays in each nostril daily (can buy over-the-counter if not covered by insurance)  Best results if used daily. - Continue Astelin (Azelastine) 1-2 sprays in each nostril twice a day as needed.  You may use this as needed for nasal congestion/itchy ears/itchy nose if desired - Continue Singulair (Montelukast) 10mg  nightly. - Continue over the counter antihistamine daily or daily as need ed.  Can increase to twice daily on days when hives occur. -Your options include Zyrtec (Cetirizine) 10mg , Claritin (Loratadine) 10mg , Allegra (Fexofenadine) 180mg , or Xyzal (Levocetirinze) 5mg    Allergic Conjunctivitis:  - Consider Allergy Eye drops: great options include Pataday (Olopatadine) or Zaditor (ketotifen) for eye symptoms daily as needed-both sold over the counter if not covered by insurance.   -Avoid eye drops that say  red eye relief as they may contain medications that dry out your eyes.  Concern For Stinging Insect Allergy: Skin testing and blood work negative to honeybee, yellow jacket, yellow hornet, white hornet, and wasp -based on history, would continue to carry epipen in case of repeat reaction - for SKIN only, can take Benadryl 2 tsp (25 mg)  - practice avoidance measures as outlined below when possible  Follow-up in 6 months, sooner if needed.  It was wonderful seeing you today! Thank you for letting me participate in your care.  Tonny Bollman, MD [facility]

## 2023-02-11 NOTE — Addendum Note (Signed)
Addended by: Verlee Monte on: 02/11/2023 01:32 PM   Modules accepted: Orders

## 2023-02-26 ENCOUNTER — Ambulatory Visit (INDEPENDENT_AMBULATORY_CARE_PROVIDER_SITE_OTHER): Payer: 59

## 2023-02-26 DIAGNOSIS — J309 Allergic rhinitis, unspecified: Secondary | ICD-10-CM | POA: Diagnosis not present

## 2023-03-03 DIAGNOSIS — M25552 Pain in left hip: Secondary | ICD-10-CM | POA: Diagnosis not present

## 2023-03-03 DIAGNOSIS — Z96642 Presence of left artificial hip joint: Secondary | ICD-10-CM | POA: Diagnosis not present

## 2023-03-04 DIAGNOSIS — J3081 Allergic rhinitis due to animal (cat) (dog) hair and dander: Secondary | ICD-10-CM | POA: Diagnosis not present

## 2023-03-04 DIAGNOSIS — Z96642 Presence of left artificial hip joint: Secondary | ICD-10-CM | POA: Diagnosis not present

## 2023-03-04 DIAGNOSIS — M25552 Pain in left hip: Secondary | ICD-10-CM | POA: Diagnosis not present

## 2023-03-04 NOTE — Progress Notes (Signed)
VIALS EXP 03-03-24 

## 2023-03-05 DIAGNOSIS — J302 Other seasonal allergic rhinitis: Secondary | ICD-10-CM | POA: Diagnosis not present

## 2023-03-11 DIAGNOSIS — Z96642 Presence of left artificial hip joint: Secondary | ICD-10-CM | POA: Diagnosis not present

## 2023-03-11 DIAGNOSIS — M25552 Pain in left hip: Secondary | ICD-10-CM | POA: Diagnosis not present

## 2023-03-13 DIAGNOSIS — Z96642 Presence of left artificial hip joint: Secondary | ICD-10-CM | POA: Diagnosis not present

## 2023-03-13 DIAGNOSIS — M25552 Pain in left hip: Secondary | ICD-10-CM | POA: Diagnosis not present

## 2023-03-16 ENCOUNTER — Ambulatory Visit (INDEPENDENT_AMBULATORY_CARE_PROVIDER_SITE_OTHER): Payer: 59 | Admitting: *Deleted

## 2023-03-16 DIAGNOSIS — Z96642 Presence of left artificial hip joint: Secondary | ICD-10-CM | POA: Diagnosis not present

## 2023-03-16 DIAGNOSIS — M25552 Pain in left hip: Secondary | ICD-10-CM | POA: Diagnosis not present

## 2023-03-16 DIAGNOSIS — J309 Allergic rhinitis, unspecified: Secondary | ICD-10-CM

## 2023-03-18 DIAGNOSIS — M25552 Pain in left hip: Secondary | ICD-10-CM | POA: Diagnosis not present

## 2023-03-18 DIAGNOSIS — Z96642 Presence of left artificial hip joint: Secondary | ICD-10-CM | POA: Diagnosis not present

## 2023-03-20 DIAGNOSIS — M1610 Unilateral primary osteoarthritis, unspecified hip: Secondary | ICD-10-CM | POA: Diagnosis not present

## 2023-03-20 DIAGNOSIS — D6859 Other primary thrombophilia: Secondary | ICD-10-CM | POA: Diagnosis not present

## 2023-03-20 DIAGNOSIS — Z0000 Encounter for general adult medical examination without abnormal findings: Secondary | ICD-10-CM | POA: Diagnosis not present

## 2023-03-20 DIAGNOSIS — E039 Hypothyroidism, unspecified: Secondary | ICD-10-CM | POA: Diagnosis not present

## 2023-03-20 DIAGNOSIS — G379 Demyelinating disease of central nervous system, unspecified: Secondary | ICD-10-CM | POA: Diagnosis not present

## 2023-03-20 DIAGNOSIS — G894 Chronic pain syndrome: Secondary | ICD-10-CM | POA: Diagnosis not present

## 2023-03-20 DIAGNOSIS — D84821 Immunodeficiency due to drugs: Secondary | ICD-10-CM | POA: Diagnosis not present

## 2023-03-20 DIAGNOSIS — M069 Rheumatoid arthritis, unspecified: Secondary | ICD-10-CM | POA: Diagnosis not present

## 2023-03-20 DIAGNOSIS — M3500 Sicca syndrome, unspecified: Secondary | ICD-10-CM | POA: Diagnosis not present

## 2023-03-23 DIAGNOSIS — M1909 Primary osteoarthritis, other specified site: Secondary | ICD-10-CM | POA: Diagnosis not present

## 2023-03-23 DIAGNOSIS — M47819 Spondylosis without myelopathy or radiculopathy, site unspecified: Secondary | ICD-10-CM | POA: Diagnosis not present

## 2023-03-23 DIAGNOSIS — Z96642 Presence of left artificial hip joint: Secondary | ICD-10-CM | POA: Diagnosis not present

## 2023-03-24 DIAGNOSIS — Z96642 Presence of left artificial hip joint: Secondary | ICD-10-CM | POA: Diagnosis not present

## 2023-03-24 DIAGNOSIS — M25552 Pain in left hip: Secondary | ICD-10-CM | POA: Diagnosis not present

## 2023-03-26 DIAGNOSIS — H04123 Dry eye syndrome of bilateral lacrimal glands: Secondary | ICD-10-CM | POA: Diagnosis not present

## 2023-03-26 DIAGNOSIS — H469 Unspecified optic neuritis: Secondary | ICD-10-CM | POA: Diagnosis not present

## 2023-03-26 DIAGNOSIS — Z79899 Other long term (current) drug therapy: Secondary | ICD-10-CM | POA: Diagnosis not present

## 2023-03-26 DIAGNOSIS — H524 Presbyopia: Secondary | ICD-10-CM | POA: Diagnosis not present

## 2023-03-26 DIAGNOSIS — H25813 Combined forms of age-related cataract, bilateral: Secondary | ICD-10-CM | POA: Diagnosis not present

## 2023-03-26 DIAGNOSIS — H3581 Retinal edema: Secondary | ICD-10-CM | POA: Diagnosis not present

## 2023-03-30 ENCOUNTER — Ambulatory Visit (INDEPENDENT_AMBULATORY_CARE_PROVIDER_SITE_OTHER): Payer: 59 | Admitting: *Deleted

## 2023-03-30 DIAGNOSIS — J309 Allergic rhinitis, unspecified: Secondary | ICD-10-CM | POA: Diagnosis not present

## 2023-03-30 DIAGNOSIS — M25552 Pain in left hip: Secondary | ICD-10-CM | POA: Diagnosis not present

## 2023-03-30 DIAGNOSIS — Z96642 Presence of left artificial hip joint: Secondary | ICD-10-CM | POA: Diagnosis not present

## 2023-04-01 DIAGNOSIS — Z96642 Presence of left artificial hip joint: Secondary | ICD-10-CM | POA: Diagnosis not present

## 2023-04-01 DIAGNOSIS — M25552 Pain in left hip: Secondary | ICD-10-CM | POA: Diagnosis not present

## 2023-04-08 DIAGNOSIS — M25552 Pain in left hip: Secondary | ICD-10-CM | POA: Diagnosis not present

## 2023-04-08 DIAGNOSIS — Z96642 Presence of left artificial hip joint: Secondary | ICD-10-CM | POA: Diagnosis not present

## 2023-04-13 ENCOUNTER — Ambulatory Visit (INDEPENDENT_AMBULATORY_CARE_PROVIDER_SITE_OTHER): Payer: 59

## 2023-04-13 DIAGNOSIS — M25552 Pain in left hip: Secondary | ICD-10-CM | POA: Diagnosis not present

## 2023-04-13 DIAGNOSIS — Z96642 Presence of left artificial hip joint: Secondary | ICD-10-CM | POA: Diagnosis not present

## 2023-04-13 DIAGNOSIS — J309 Allergic rhinitis, unspecified: Secondary | ICD-10-CM | POA: Diagnosis not present

## 2023-04-15 DIAGNOSIS — Z96642 Presence of left artificial hip joint: Secondary | ICD-10-CM | POA: Diagnosis not present

## 2023-04-15 DIAGNOSIS — M25552 Pain in left hip: Secondary | ICD-10-CM | POA: Diagnosis not present

## 2023-04-20 DIAGNOSIS — Z96642 Presence of left artificial hip joint: Secondary | ICD-10-CM | POA: Diagnosis not present

## 2023-04-20 DIAGNOSIS — M25552 Pain in left hip: Secondary | ICD-10-CM | POA: Diagnosis not present

## 2023-04-27 ENCOUNTER — Ambulatory Visit (INDEPENDENT_AMBULATORY_CARE_PROVIDER_SITE_OTHER): Payer: 59

## 2023-04-27 DIAGNOSIS — J309 Allergic rhinitis, unspecified: Secondary | ICD-10-CM | POA: Diagnosis not present

## 2023-04-29 DIAGNOSIS — M25552 Pain in left hip: Secondary | ICD-10-CM | POA: Diagnosis not present

## 2023-04-29 DIAGNOSIS — Z96642 Presence of left artificial hip joint: Secondary | ICD-10-CM | POA: Diagnosis not present

## 2023-05-04 ENCOUNTER — Other Ambulatory Visit: Payer: Self-pay | Admitting: Internal Medicine

## 2023-05-04 ENCOUNTER — Ambulatory Visit (INDEPENDENT_AMBULATORY_CARE_PROVIDER_SITE_OTHER): Payer: 59

## 2023-05-04 DIAGNOSIS — J309 Allergic rhinitis, unspecified: Secondary | ICD-10-CM | POA: Diagnosis not present

## 2023-05-04 DIAGNOSIS — Z96642 Presence of left artificial hip joint: Secondary | ICD-10-CM | POA: Diagnosis not present

## 2023-05-04 DIAGNOSIS — M25552 Pain in left hip: Secondary | ICD-10-CM | POA: Diagnosis not present

## 2023-05-08 DIAGNOSIS — H3581 Retinal edema: Secondary | ICD-10-CM | POA: Diagnosis not present

## 2023-05-11 ENCOUNTER — Ambulatory Visit (INDEPENDENT_AMBULATORY_CARE_PROVIDER_SITE_OTHER): Payer: 59

## 2023-05-11 DIAGNOSIS — J309 Allergic rhinitis, unspecified: Secondary | ICD-10-CM

## 2023-05-11 DIAGNOSIS — Z96642 Presence of left artificial hip joint: Secondary | ICD-10-CM | POA: Diagnosis not present

## 2023-05-11 DIAGNOSIS — M25552 Pain in left hip: Secondary | ICD-10-CM | POA: Diagnosis not present

## 2023-05-13 DIAGNOSIS — H3581 Retinal edema: Secondary | ICD-10-CM | POA: Diagnosis not present

## 2023-05-18 ENCOUNTER — Ambulatory Visit (INDEPENDENT_AMBULATORY_CARE_PROVIDER_SITE_OTHER): Payer: 59

## 2023-05-18 DIAGNOSIS — J309 Allergic rhinitis, unspecified: Secondary | ICD-10-CM | POA: Diagnosis not present

## 2023-05-21 DIAGNOSIS — M25552 Pain in left hip: Secondary | ICD-10-CM | POA: Diagnosis not present

## 2023-05-21 DIAGNOSIS — Z96642 Presence of left artificial hip joint: Secondary | ICD-10-CM | POA: Diagnosis not present

## 2023-05-25 DIAGNOSIS — Z96642 Presence of left artificial hip joint: Secondary | ICD-10-CM | POA: Diagnosis not present

## 2023-05-25 DIAGNOSIS — M25552 Pain in left hip: Secondary | ICD-10-CM | POA: Diagnosis not present

## 2023-05-26 DIAGNOSIS — M3500 Sicca syndrome, unspecified: Secondary | ICD-10-CM | POA: Diagnosis not present

## 2023-05-28 ENCOUNTER — Ambulatory Visit (INDEPENDENT_AMBULATORY_CARE_PROVIDER_SITE_OTHER): Payer: 59

## 2023-05-28 DIAGNOSIS — J309 Allergic rhinitis, unspecified: Secondary | ICD-10-CM

## 2023-06-01 ENCOUNTER — Encounter: Payer: Self-pay | Admitting: Internal Medicine

## 2023-06-01 ENCOUNTER — Ambulatory Visit (INDEPENDENT_AMBULATORY_CARE_PROVIDER_SITE_OTHER): Payer: 59 | Admitting: Internal Medicine

## 2023-06-01 ENCOUNTER — Other Ambulatory Visit: Payer: Self-pay

## 2023-06-01 VITALS — BP 126/78 | HR 75 | Temp 97.5°F | Resp 16 | Ht 65.0 in | Wt 204.2 lb

## 2023-06-01 DIAGNOSIS — M25552 Pain in left hip: Secondary | ICD-10-CM | POA: Diagnosis not present

## 2023-06-01 DIAGNOSIS — Z880 Allergy status to penicillin: Secondary | ICD-10-CM | POA: Diagnosis not present

## 2023-06-01 DIAGNOSIS — J4540 Moderate persistent asthma, uncomplicated: Secondary | ICD-10-CM

## 2023-06-01 DIAGNOSIS — J3089 Other allergic rhinitis: Secondary | ICD-10-CM

## 2023-06-01 DIAGNOSIS — Z91038 Other insect allergy status: Secondary | ICD-10-CM | POA: Diagnosis not present

## 2023-06-01 DIAGNOSIS — Z96642 Presence of left artificial hip joint: Secondary | ICD-10-CM | POA: Diagnosis not present

## 2023-06-01 DIAGNOSIS — J302 Other seasonal allergic rhinitis: Secondary | ICD-10-CM | POA: Diagnosis not present

## 2023-06-01 DIAGNOSIS — H1013 Acute atopic conjunctivitis, bilateral: Secondary | ICD-10-CM

## 2023-06-01 MED ORDER — TRIAMCINOLONE ACETONIDE 0.1 % EX OINT: TOPICAL_OINTMENT | CUTANEOUS | 1 refills | Status: DC

## 2023-06-01 MED ORDER — LEVOCETIRIZINE DIHYDROCHLORIDE 5 MG PO TABS: 5.0000 mg | ORAL_TABLET | Freq: Every evening | ORAL | 5 refills | Status: DC

## 2023-06-01 MED ORDER — MONTELUKAST SODIUM 10 MG PO TABS: 10.0000 mg | ORAL_TABLET | Freq: Every day | ORAL | 5 refills | Status: DC

## 2023-06-01 MED ORDER — HYDROCORTISONE 2.5 % EX OINT: TOPICAL_OINTMENT | CUTANEOUS | 2 refills | Status: AC

## 2023-06-01 NOTE — Progress Notes (Signed)
FOLLOW UP Date of Service/Encounter:  06/01/23  Subjective:  Sara Hayes (DOB: 01-12-1971) is a 52 y.o. female who [facility] in re-evaluation of the following:  Asthma, allergic rhinitis, history of venom allergy  History obtained from: chart review and patient.  For Review, LV was on 02/11/23  with Dr.Vernal Hritz seen for routine follow-up. See below for summary of history and diagnostics.   Therapeutic plans/changes recommended: She was doing well, current meds continued as below. ----------------------------------------------------- Pertinent History/Diagnostics:  Asthma:  Moderate persistent.  No history of hospitalizations, no steroids in the past year.  Uses rescue inhaler around once a week. Has a difficult time using HFA inhalers due to arthritis. -  spirometry (01/08/2022): ratio 102%, 87% FEV1 -Prior PFTs or spirometry: 2015-normal pulmonary function test with ratio of 90% and FEV1 of 101%- - Most Recent AEC (10/14/21): 200 Current Meds: Arnuity 200 Diskus 1 puff daily, ProAir Respiclick as needed Allergic Rhinitis:  Moved from [place] to [place] in 2012.  Uncontrolled with daily antihistamine, Flonase, and Singulair daily.Uses restasis for dry eyes.  Follows with ophthalmologist.  Last appointment was 2022 in the Spring.  Started AIT 02/11/2022.  Receiving Vial 1 (grass, dust mite, cat) vial 2 (cockroach and molds). Reached maintenance dose of 0.5 mL red vial on 08/20/22. - SPT environmental panel (01/08/2022): positive to cats, intradermals positive to [place] grass, mold mix 1, 2, 3 and 4, cockroach, mite mix Current meds: Flonase, azelastine nasal spray, Singulair, oral OTC AH, OTC allergy eyedrops Hymenoptera allergy   - Hx of reaction: Developed a local reaction but then became lightheaded, no syncope, but did require emergency room treatment. -IgE negative to honeybee, white hornet, yellow jacket, paper wasp, yellow  hornet, bumblebee, tryptase 3.5 (normal baseline).   - skin testing 04/22/22: negative to honeybee, yellow jacket, yellow hornet, white hornet, and wasp Current Meds: EpiPen as needed. 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  Testing followed by challenge if indicated offered. Other: has Multiple sclerosis --------------------------------------------------- Today presents for follow-up. Today reports that she is having a lot of issues with bug bites.  She is getting them recurrently on her arms and they are itchy.  She is using hydrocortisone which helps relieve itch.  Occasionally will have a clear fluid oozing from then.  No fevers or streaking.  [place]. Her allergy injections are continuing to improve her allergy symptoms.  She does tolerate her injections well.  The stray cats have left her vicinity which has also helped. Her asthma is also controlled.  For the most part she is consistent with her daily Arnuity.  Rarely using her albuterol.   All medications reviewed by clinical staff and updated in chart. No new pertinent medical or surgical history except as noted in HPI.  ROS: All others negative except as noted per HPI.   Objective:  BP 126/78   Pulse 75   Temp (!) 97.5 F (36.4 C)   Resp 16   Ht 5\' 5"  (1.651 m)   Wt 204 lb 3.2 oz (92.6 kg)   SpO2 98%   BMI 33.98 kg/m  Body mass index is 33.98 kg/m. Physical Exam: General Appearance:  Alert, cooperative, no distress, appears stated age  Head:  Normocephalic, without obvious abnormality, atraumatic  Eyes:  Conjunctiva clear, EOM's intact  Ears EACs normal bilaterally and normal TMs bilaterally  Nose: Nares normal, hypertrophic turbinates,  normal mucosa, and no visible anterior polyps  Throat: Lips, tongue normal; teeth and gums normal, normal posterior oropharynx  Neck: Supple, symmetrical  Lungs:   clear to auscultation bilaterally,  Respirations unlabored, no coughing  Heart:  regular rate and rhythm and no murmur, Appears well perfused  Extremities: No edema  Skin: Multiple hyperpigmented papules scattered on bilateral upper extremities with overlying excoriations, no oozing or streaking, Skin color, texture, turgor normal, and no rashes or lesions on visualized portions of skin  Neurologic: No gross deficits   Labs:  Lab Orders  No laboratory test(s) ordered today    Spirometry:  Tracings reviewed. Her effort: Good reproducible efforts. FVC: 2.82L FEV1: 2.45L, 100% predicted FEV1/FVC ratio: 0.87 Interpretation: Spirometry consistent with normal pattern.  Please see scanned spirometry results for details.  Assessment/Plan   Mosquito/insect bites-acute problem Avoidance measures (DEET repellant for mosquitos, long clothing, etc) If bite occurs with raised rash:  - For itch: Topical steroid (hydrocortisone cream or triamcinolone - only use on body) twice daily as needed + oral antihistamine (zyrtec) - For pain and swelling: Oral anti-inflammatory (ibuprofen), ice affected area Use sunblock on areas where skin is inflamed to prevent further pigmentation. Keep bites clean with warm water and antibacterial soap.  Moderate Persistent Asthma: at goal - Controller Inhaler: Continue Arnuity 200 diskus, 1 puff once a day; This Should Be Used Everyday - Rinse mouth out after use - Rescue Inhaler: Albuterol (Proair/Ventolin) 2 puffs . Use  every 4-6 hours as needed for chest tightness, wheezing, or coughing.  Can also use 15 minutes prior to exercise if you have symptoms with activity. - Asthma is not controlled if:  - Symptoms are occurring >2 times a week OR  - >2 times a month nighttime awakenings  - You are requiring systemic steroids (prednisone/steroid injections) more than once per year  - Your require hospitalization for your asthma.  - Please call the clinic to schedule a follow up if these symptoms  arise  Perennial and seasonal allergic rhinitis: at goal - allergen avoidance towards cats, [place] grass, indoor/outdoor molds, cockroach, mite mix - continue allergy injections as per protocol, continue to bring epinephrine auto-injector to your appointment Prior to medicated nasal sprays, use nasal rinse or nasal spray (Simply Saline, etc) to moisten and loosen mucus. - Continue  Flonase (fluticasone) 1- 2 sprays in each nostril daily (can buy over-the-counter if not covered by insurance)  Best results if used daily. - Continue Astelin (Azelastine) 1-2 sprays in each nostril twice a day as needed.  You may use this as needed for nasal congestion/itchy ears/itchy nose if desired - Continue Singulair (Montelukast) 10mg  nightly. - Continue over the counter antihistamine daily or daily as need ed.  Can increase to twice daily on days when hives occur. -Your options include Zyrtec (Cetirizine) 10mg , Claritin (Loratadine) 10mg , Allegra (Fexofenadine) 180mg , or Xyzal (Levocetirinze) 5mg    Allergic Conjunctivitis: at goal - Consider Allergy Eye drops: great options include Pataday (Olopatadine) or Zaditor (ketotifen) for eye symptoms daily as needed-both sold over the counter if not covered by insurance.   -Avoid eye drops that say red eye relief as they may contain medications that dry out your eyes.   Follow-up in 6 months, sooner if needed.  It was wonderful seeing you today! Thank you for letting me participate in your care.  Other:  none  Tonny Bollman, MD  [facility]

## 2023-06-01 NOTE — Patient Instructions (Addendum)
Mosquito/insect bites Avoidance measures (DEET repellant for mosquitos, long clothing, etc) If bite occurs with raised rash:  - For itch: Topical steroid (hydrocortisone cream or triamcinolone - only use on body) twice daily as needed + oral antihistamine (zyrtec) - For pain and swelling: Oral anti-inflammatory (ibuprofen), ice affected area Use sunblock on areas where skin is inflamed to prevent further pigmentation. Keep bites clean with warm water and antibacterial soap.  Moderate Persistent Asthma: - Controller Inhaler: Continue Arnuity 200 diskus, 1 puff once a day; This Should Be Used Everyday - Rinse mouth out after use - Rescue Inhaler: Albuterol (Proair/Ventolin) 2 puffs . Use  every 4-6 hours as needed for chest tightness, wheezing, or coughing.  Can also use 15 minutes prior to exercise if you have symptoms with activity. - Asthma is not controlled if:  - Symptoms are occurring >2 times a week OR  - >2 times a month nighttime awakenings  - You are requiring systemic steroids (prednisone/steroid injections) more than once per year  - Your require hospitalization for your asthma.  - Please call the clinic to schedule a follow up if these symptoms arise  Perennial and seasonal allergic rhinitis: - allergen avoidance towards cats, [place] grass, indoor/outdoor molds, cockroach, mite mix - continue allergy injections as per protocol, continue to bring epinephrine auto-injector to your appointment Prior to medicated nasal sprays, use nasal rinse or nasal spray (Simply Saline, etc) to moisten and loosen mucus. - Continue  Flonase (fluticasone) 1- 2 sprays in each nostril daily (can buy over-the-counter if not covered by insurance)  Best results if used daily. - Continue Astelin (Azelastine) 1-2 sprays in each nostril twice a day as needed.  You may use this as needed for nasal congestion/itchy ears/itchy nose if desired - Continue Singulair (Montelukast) 10mg  nightly. - Continue over  the counter antihistamine daily or daily as need ed.  Can increase to twice daily on days when hives occur. -Your options include Zyrtec (Cetirizine) 10mg , Claritin (Loratadine) 10mg , Allegra (Fexofenadine) 180mg , or Xyzal (Levocetirinze) 5mg    Allergic Conjunctivitis:  - Consider Allergy Eye drops: great options include Pataday (Olopatadine) or Zaditor (ketotifen) for eye symptoms daily as needed-both sold over the counter if not covered by insurance.   -Avoid eye drops that say red eye relief as they may contain medications that dry out your eyes.   Follow-up in 6 months, sooner if needed.  It was wonderful seeing you today! Thank you for letting me participate in your care.  Tonny Bollman, MD [facility]

## 2023-06-10 DIAGNOSIS — Z96642 Presence of left artificial hip joint: Secondary | ICD-10-CM | POA: Diagnosis not present

## 2023-06-10 DIAGNOSIS — M25552 Pain in left hip: Secondary | ICD-10-CM | POA: Diagnosis not present

## 2023-06-12 ENCOUNTER — Ambulatory Visit (INDEPENDENT_AMBULATORY_CARE_PROVIDER_SITE_OTHER): Payer: 59

## 2023-06-12 DIAGNOSIS — J309 Allergic rhinitis, unspecified: Secondary | ICD-10-CM | POA: Diagnosis not present

## 2023-06-26 ENCOUNTER — Ambulatory Visit (INDEPENDENT_AMBULATORY_CARE_PROVIDER_SITE_OTHER): Payer: 59 | Admitting: *Deleted

## 2023-06-26 DIAGNOSIS — J309 Allergic rhinitis, unspecified: Secondary | ICD-10-CM | POA: Diagnosis not present

## 2023-07-07 ENCOUNTER — Ambulatory Visit (INDEPENDENT_AMBULATORY_CARE_PROVIDER_SITE_OTHER): Payer: 59 | Admitting: *Deleted

## 2023-07-07 DIAGNOSIS — J309 Allergic rhinitis, unspecified: Secondary | ICD-10-CM | POA: Diagnosis not present

## 2023-07-14 DIAGNOSIS — J3081 Allergic rhinitis due to animal (cat) (dog) hair and dander: Secondary | ICD-10-CM | POA: Diagnosis not present

## 2023-07-14 NOTE — Progress Notes (Signed)
VIALS EXP 07-13-24

## 2023-07-15 DIAGNOSIS — J302 Other seasonal allergic rhinitis: Secondary | ICD-10-CM | POA: Diagnosis not present

## 2023-07-21 ENCOUNTER — Ambulatory Visit (INDEPENDENT_AMBULATORY_CARE_PROVIDER_SITE_OTHER): Payer: 59 | Admitting: *Deleted

## 2023-07-21 DIAGNOSIS — J309 Allergic rhinitis, unspecified: Secondary | ICD-10-CM | POA: Diagnosis not present

## 2023-07-30 ENCOUNTER — Other Ambulatory Visit: Payer: Self-pay | Admitting: Internal Medicine

## 2023-08-04 ENCOUNTER — Ambulatory Visit (INDEPENDENT_AMBULATORY_CARE_PROVIDER_SITE_OTHER): Payer: 59 | Admitting: *Deleted

## 2023-08-04 DIAGNOSIS — J309 Allergic rhinitis, unspecified: Secondary | ICD-10-CM

## 2023-08-19 ENCOUNTER — Ambulatory Visit: Payer: 59 | Admitting: Internal Medicine

## 2023-08-28 ENCOUNTER — Other Ambulatory Visit: Payer: Self-pay | Admitting: Internal Medicine

## 2023-08-28 DIAGNOSIS — Z1231 Encounter for screening mammogram for malignant neoplasm of breast: Secondary | ICD-10-CM

## 2023-09-07 ENCOUNTER — Other Ambulatory Visit: Payer: Self-pay | Admitting: *Deleted

## 2023-09-07 ENCOUNTER — Ambulatory Visit: Payer: Self-pay | Admitting: *Deleted

## 2023-09-07 MED ORDER — EPINEPHRINE 0.3 MG/0.3ML IJ SOAJ: 0.3000 mg | INTRAMUSCULAR | 1 refills | Status: DC | PRN

## 2023-09-09 DIAGNOSIS — K5790 Diverticulosis of intestine, part unspecified, without perforation or abscess without bleeding: Secondary | ICD-10-CM | POA: Diagnosis not present

## 2023-09-09 DIAGNOSIS — Z23 Encounter for immunization: Secondary | ICD-10-CM | POA: Diagnosis not present

## 2023-09-14 DIAGNOSIS — H3581 Retinal edema: Secondary | ICD-10-CM | POA: Diagnosis not present

## 2023-09-21 ENCOUNTER — Ambulatory Visit (INDEPENDENT_AMBULATORY_CARE_PROVIDER_SITE_OTHER): Payer: 59 | Admitting: Internal Medicine

## 2023-09-21 ENCOUNTER — Encounter: Payer: Self-pay | Admitting: Internal Medicine

## 2023-09-21 ENCOUNTER — Other Ambulatory Visit: Payer: Self-pay

## 2023-09-21 VITALS — BP 112/80 | HR 76 | Temp 97.4°F | Wt 211.9 lb

## 2023-09-21 DIAGNOSIS — L239 Allergic contact dermatitis, unspecified cause: Secondary | ICD-10-CM

## 2023-09-21 DIAGNOSIS — H1013 Acute atopic conjunctivitis, bilateral: Secondary | ICD-10-CM | POA: Diagnosis not present

## 2023-09-21 DIAGNOSIS — J3089 Other allergic rhinitis: Secondary | ICD-10-CM | POA: Diagnosis not present

## 2023-09-21 DIAGNOSIS — J4540 Moderate persistent asthma, uncomplicated: Secondary | ICD-10-CM | POA: Diagnosis not present

## 2023-09-21 DIAGNOSIS — J302 Other seasonal allergic rhinitis: Secondary | ICD-10-CM | POA: Diagnosis not present

## 2023-09-21 NOTE — Patient Instructions (Addendum)
Contact Dermatitis Recurrent rash on upper arms and chest, likely due to contact with an allergen. Discussed potential irritants including detergents, lotions, and natural oils. -Continue using hypoallergenic products and avoid scented items. -Apply Triamcinolone cream twice daily to affected areas. -Consider patch testing if rash persists.  Moderate Persistent Asthma: - Controller Inhaler: Continue Arnuity 200 diskus, 1 puff once a day; This Should Be Used Everyday - Rinse mouth out after use - Rescue Inhaler: Albuterol (Proair/Ventolin) 2 puffs . Use  every 4-6 hours as needed for chest tightness, wheezing, or coughing.  Can also use 15 minutes prior to exercise if you have symptoms with activity. - Asthma is not controlled if:  - Symptoms are occurring >2 times a week OR  - >2 times a month nighttime awakenings  - You are requiring systemic steroids (prednisone/steroid injections) more than once per year  - Your require hospitalization for your asthma.  - Please call the clinic to schedule a follow up if these symptoms arise  Perennial and seasonal allergic rhinitis: - allergen avoidance towards cats, [place] grass, indoor/outdoor molds, cockroach, mite mix - continue allergy injections as per protocol, continue to bring epinephrine auto-injector to your appointment Prior to medicated nasal sprays, use nasal rinse or nasal spray (Simply Saline, etc) to moisten and loosen mucus. - Continue  Flonase (fluticasone) 1- 2 sprays in each nostril daily (can buy over-the-counter if not covered by insurance)  Best results if used daily. - Continue Astelin (Azelastine) 1-2 sprays in each nostril twice a day as needed.  You may use this as needed for nasal congestion/itchy ears/itchy nose if desired - Continue Singulair (Montelukast) 10mg  nightly. - Continue over the counter antihistamine daily or daily as need ed.  Can increase to twice daily on days when hives occur. -Your options include Zyrtec  (Cetirizine) 10mg , Claritin (Loratadine) 10mg , Allegra (Fexofenadine) 180mg , or Xyzal (Levocetirinze) 5mg    Allergic Conjunctivitis:  - Consider Allergy Eye drops: great options include Pataday (Olopatadine) or Zaditor (ketotifen) for eye symptoms daily as needed-both sold over the counter if not covered by insurance.   -Avoid eye drops that say red eye relief as they may contain medications that dry out your eyes.   Follow-up in 6 months, sooner if needed.  It was wonderful seeing you today! Thank you for letting me participate in your care.  Tonny Bollman, MD [facility]

## 2023-09-21 NOTE — Progress Notes (Signed)
FOLLOW UP Date of Service/Encounter:  09/21/23  Subjective:  Sara Hayes (DOB: 08-10-1971) is a 52 y.o. female who [facility] for the following: acute visit for hives. History obtained from: chart review and patient.  For Review, LV was on 06/01/23  with Dr.Robyn Nohr seen for routine follow-up. See below for summary of history and diagnostics.   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 Discussed symptomatic care. ----------------------------------------------------- Pertinent History/Diagnostics:  Asthma:  Moderate persistent.  No history of hospitalizations, no steroids in the past year.  Uses rescue inhaler around once a week. Has a difficult time using HFA inhalers due to arthritis. -  spirometry (01/08/2022): ratio 102%, 87% FEV1 -Prior PFTs or spirometry: 2015-normal pulmonary function test with ratio of 90% and FEV1 of 101%- - Most Recent AEC (10/14/21): 200 Current Meds: Arnuity 200 Diskus 1 puff daily, ProAir Respiclick as needed Allergic Rhinitis:  Moved from [place] to [place] in 2012.  Uncontrolled with daily antihistamine, Flonase, and Singulair daily.Uses restasis for dry eyes.  Follows with ophthalmologist.  Last appointment was 2022 in the Spring.  Started AIT 02/11/2022.  Receiving Vial 1 (grass, dust mite, cat) vial 2 (cockroach and molds). Reached maintenance dose of 0.5 mL red vial on 08/20/22. - SPT environmental panel (01/08/2022): positive to cats, intradermals positive to [place] grass, mold mix 1, 2, 3 and 4, cockroach, mite mix Current meds: Flonase, azelastine nasal spray, Singulair, oral OTC AH, OTC allergy eyedrops Hymenoptera allergy   - Hx of reaction: Developed a local reaction but then became lightheaded, no syncope, but did require emergency room treatment. -IgE negative to honeybee, white hornet, yellow jacket, paper wasp, yellow hornet, bumblebee,  tryptase 3.5 (normal baseline).   - skin testing 04/22/22: negative to honeybee, yellow jacket, yellow hornet, white hornet, and wasp Current Meds: EpiPen as needed. 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  Testing followed by challenge if indicated offered. Other: has Multiple sclerosis --------------------------------------------------- Today presents for follow-up. Discussed the use of AI scribe software for clinical note transcription with the patient, who gave verbal consent to proceed.  History of Present Illness   The patient, with a history of allergies and asthma, presents with recurrent skin eruptions. She describes the rash as localized to the upper arms and chest, with no involvement of the face or legs. The rash is characterized by small red bumps, which the patient denies are hives or bug bites. The patient reports a recent infestation of roaches in her home, which has since been resolved. She also mentions exposure to mold in her home, which she has been actively cleaning.  The patient's first episode of skin eruptions occurred around the time of the roach infestation, and she initially attributed the rash to this. However, the rash has recurred despite the resolution of the infestation. The patient has made several changes to her personal care regimen in an attempt to alleviate the rash. She has stopped using scented lotions and bath products, and has switched to using baby wash and a natural oatmeal product. She has also started using an herbal oil on her skin.  The patient has been managing her allergies with monthly allergy shots. However, she was denied her most recent shot due to the presence of the rash. The patient also has a history of asthma, which she manages with a daily arnuity inhaler (BID) and albuterol as needed. She reports  a recent episode of chest tightness, which she managed with her albuterol inhaler.  The  patient's daily activities have been significantly impacted by her efforts to manage the roach infestation and mold exposure in her home. She has been using boric acid and noise repellents to combat the roaches, and has been actively cleaning to remove mold. She has also installed filters on her water sources due to concerns about the local water quality. Despite these challenges, the patient is committed to maintaining her health and managing her allergies and asthma.        All medications reviewed by clinical staff and updated in chart. No new pertinent medical or surgical history except as noted in HPI.  ROS: All others negative except as noted per HPI.   Objective:  BP 112/80 (BP Location: Left Arm, Cuff Size: Large)   Pulse 76   Temp (!) 97.4 F (36.3 C) (Temporal)   Wt 211 lb 14.4 oz (96.1 kg)   SpO2 97%   BMI 35.26 kg/m  Body mass index is 35.26 kg/m. Physical Exam: General Appearance:  Alert, cooperative, no distress, appears stated age  Head:  Normocephalic, without obvious abnormality, atraumatic  Eyes:  Conjunctiva clear, EOM's intact  Ears EACs normal bilaterally and normal TMs bilaterally  Nose: Nares normal, hypertrophic turbinates, normal mucosa, and no visible anterior polyps  Throat: Lips, tongue normal; teeth and gums normal, normal posterior oropharynx  Neck: Supple, symmetrical  Lungs:   clear to auscultation bilaterally, Respirations unlabored, no coughing  Heart:  regular rate and rhythm and no murmur, Appears well perfused  Extremities: No edema  Skin: erythematous, dry patches scattered on bilateral upper arms and upper chest (exposed areas of skin only)  Neurologic: No gross deficits   Labs:  Lab Orders  No laboratory test(s) ordered today   Spirometry:  FVC: 2.72L  FEV1: 2.23L, 92% predicted  FEV1/FVC ratio: 0.82 Interpretation: Spirometry consistent with normal pattern    Assessment/Plan   Contact Dermatitis-unspecified trigger. Recurrent  rash on upper arms and chest, likely due to contact with an allergen. Discussed potential irritants including detergents, lotions, and natural oils. -Continue using hypoallergenic products and avoid scented items. -Apply Triamcinolone cream twice daily to affected areas. -Consider patch testing if rash persists. -okay to continue allergy injections with current rash  Moderate Persistent Asthma: not at goal, suspect related to mold exposure Breathing test looked normal - Controller Inhaler: Continue Arnuity 200 diskus, 1 puff once a day; This Should Be Used Everyday - Rinse mouth out after use - Rescue Inhaler: Albuterol (Proair/Ventolin) 2 puffs . Use  every 4-6 hours as needed for chest tightness, wheezing, or coughing.  Can also use 15 minutes prior to exercise if you have symptoms with activity. - Asthma is not controlled if:  - Symptoms are occurring >2 times a week OR  - >2 times a month nighttime awakenings  - You are requiring systemic steroids (prednisone/steroid injections) more than once per year  - Your require hospitalization for your asthma.  - Please call the clinic to schedule a follow up if these symptoms arise  Perennial and seasonal allergic rhinitis: at goal - allergen avoidance towards cats, [place] grass, indoor/outdoor molds, cockroach, mite mix - continue allergy injections as per protocol, continue to bring epinephrine auto-injector to your appointment Prior to medicated nasal sprays, use nasal rinse or nasal spray (Simply Saline, etc) to moisten and loosen mucus. - Continue  Flonase (fluticasone) 1- 2 sprays in each nostril daily (can buy over-the-counter  if not covered by insurance)  Best results if used daily. - Continue Astelin (Azelastine) 1-2 sprays in each nostril twice a day as needed.  You may use this as needed for nasal congestion/itchy ears/itchy nose if desired - Continue Singulair (Montelukast) 10mg  nightly. - Continue over the counter antihistamine  daily or daily as need ed.  Can increase to twice daily on days when hives occur. -Your options include Zyrtec (Cetirizine) 10mg , Claritin (Loratadine) 10mg , Allegra (Fexofenadine) 180mg , or Xyzal (Levocetirinze) 5mg    Allergic Conjunctivitis:  - Consider Allergy Eye drops: great options include Pataday (Olopatadine) or Zaditor (ketotifen) for eye symptoms daily as needed-both sold over the counter if not covered by insurance.   -Avoid eye drops that say red eye relief as they may contain medications that dry out your eyes.   Follow-up in 6 months, sooner if needed.  It was wonderful seeing you today! Thank you for letting me participate in your care.   Other: allergy injection given in clinic today  Tonny Bollman, MD  [facility]

## 2023-10-01 ENCOUNTER — Other Ambulatory Visit: Payer: Self-pay | Admitting: Internal Medicine

## 2023-10-05 ENCOUNTER — Encounter: Payer: Self-pay | Admitting: Family Medicine

## 2023-10-05 ENCOUNTER — Ambulatory Visit: Payer: Self-pay | Admitting: *Deleted

## 2023-10-05 ENCOUNTER — Ambulatory Visit (INDEPENDENT_AMBULATORY_CARE_PROVIDER_SITE_OTHER): Payer: 59 | Admitting: Family Medicine

## 2023-10-05 ENCOUNTER — Other Ambulatory Visit: Payer: Self-pay

## 2023-10-05 VITALS — BP 102/80 | HR 64 | Temp 97.6°F | Resp 14 | Ht 65.0 in | Wt 212.5 lb

## 2023-10-05 DIAGNOSIS — H1013 Acute atopic conjunctivitis, bilateral: Secondary | ICD-10-CM | POA: Diagnosis not present

## 2023-10-05 DIAGNOSIS — J302 Other seasonal allergic rhinitis: Secondary | ICD-10-CM | POA: Diagnosis not present

## 2023-10-05 DIAGNOSIS — J45998 Other asthma: Secondary | ICD-10-CM | POA: Diagnosis not present

## 2023-10-05 DIAGNOSIS — J3089 Other allergic rhinitis: Secondary | ICD-10-CM | POA: Diagnosis not present

## 2023-10-05 DIAGNOSIS — J4540 Moderate persistent asthma, uncomplicated: Secondary | ICD-10-CM | POA: Diagnosis not present

## 2023-10-05 DIAGNOSIS — J309 Allergic rhinitis, unspecified: Secondary | ICD-10-CM

## 2023-10-05 MED ORDER — MONTELUKAST SODIUM 10 MG PO TABS: 10.0000 mg | ORAL_TABLET | Freq: Every day | ORAL | 5 refills | Status: DC

## 2023-10-05 MED ORDER — BUDESONIDE 0.5 MG/2ML IN SUSP: 0.5000 mg | Freq: Two times a day (BID) | RESPIRATORY_TRACT | 1 refills | Status: DC

## 2023-10-05 MED ORDER — FLUTICASONE PROPIONATE 50 MCG/ACT NA SUSP: 1.0000 | Freq: Two times a day (BID) | NASAL | 5 refills | Status: DC

## 2023-10-05 MED ORDER — OLOPATADINE HCL 0.2 % OP SOLN: 1.0000 [drp] | Freq: Every day | OPHTHALMIC | 5 refills | Status: DC | PRN

## 2023-10-05 MED ORDER — IPRATROPIUM-ALBUTEROL 0.5-2.5 (3) MG/3ML IN SOLN
3.0000 mL | Freq: Once | RESPIRATORY_TRACT | Status: AC
  Administered 2023-10-05: 3 mL via RESPIRATORY_TRACT

## 2023-10-05 MED ORDER — ALBUTEROL SULFATE (2.5 MG/3ML) 0.083% IN NEBU: 2.5000 mg | INHALATION_SOLUTION | RESPIRATORY_TRACT | 1 refills | Status: DC | PRN

## 2023-10-05 NOTE — Patient Instructions (Addendum)
Asthma Begin albuterol followed by budesonide via nebulizer twice a day for the next 2 weeks. This will replace Arnuity for now Continue montelukast 10 mg once a day to prevent cough or wheeze Continue albuterol 2 puffs once every 4 hours if needed for cough or wheeze You may use albuterol 2 puffs 5 to 15 minutes before activity to decrease cough or wheeze Call the clinic if your breathing worsens.  Go to the ED if you are in respiratory distress  Allergic rhinitis Continue allergen avoidance measures directed toward grass pollen, molds, cat, cockroach, and dust mite as listed below Continue an antihistamine once a day as needed for runny nose or itch. Remember to rotate to a different antihistamine about every 3 months. Some examples of over the counter antihistamines include Zyrtec (cetirizine), Xyzal (levocetirizine), Allegra (fexofenadine), and Claritin (loratidine).  Continue Flonase 1 sprays in each nostril twice a day as needed for stuffy nose Continue azelastine 2 sprays in each also up to twice a day as needed for runny nose Begin saline nasal rinses as needed for nasal symptoms. Use this before any medicated nasal sprays for best result Continue allergen immunotherapy and have access to an epinephrine autoinjector set. Do not get an injection until your breathing is back at baseline  Allergic conjunctivitis Some over the counter eye drops include Pataday one drop in each eye once a day as needed for red, itchy eyes OR Zaditor one drop in each eye twice a day as needed for red itchy eyes. Avoid eye drops that say red eye relief as they may contain medications that dry out your eyes.   Call the clinic if this treatment plan is not working well for you.  Follow up in 2 weeks or sooner if needed.  Reducing Pollen Exposure The American Academy of Allergy, Asthma and Immunology suggests the following steps to reduce your exposure to pollen during allergy seasons. Do not hang sheets or  clothing out to dry; pollen may collect on these items. Do not mow lawns or spend time around freshly cut grass; mowing stirs up pollen. Keep windows closed at night.  Keep car windows closed while driving. Minimize morning activities outdoors, a time when pollen counts are usually at their highest. Stay indoors as much as possible when pollen counts or humidity is high and on windy days when pollen tends to remain in the air longer. Use air conditioning when possible.  Many air conditioners have filters that trap the pollen spores. Use a HEPA room air filter to remove pollen form the indoor air you breathe.  Control of Mold Allergen Mold and fungi can grow on a variety of surfaces provided certain temperature and moisture conditions exist.  Outdoor molds grow on plants, decaying vegetation and soil.  The major outdoor mold, Alternaria and Cladosporium, are found in very high numbers during hot and dry conditions.  Generally, a late Summer - Fall peak is seen for common outdoor fungal spores.  Rain will temporarily lower outdoor mold spore count, but counts rise rapidly when the rainy period ends.  The most important indoor molds are Aspergillus and Penicillium.  Dark, humid and poorly ventilated basements are ideal sites for mold growth.  The next most common sites of mold growth are the bathroom and the kitchen.  Outdoor [employer] Use air conditioning and keep windows closed Avoid exposure to decaying vegetation. Avoid leaf raking. Avoid grain handling. Consider wearing a face mask if working in moldy areas.  Indoor Mold Control  Maintain humidity below 50%. Clean washable surfaces with 5% bleach solution. Remove sources e.g. Contaminated carpets.  Control of Dog or Cat Allergen Avoidance is the best way to manage a dog or cat allergy. If you have a dog or cat and are allergic to dog or cats, consider removing the dog or cat from the home. If you have a dog or cat but don't want to find  it a new home, or if your family wants a pet even though someone in the household is allergic, here are some strategies that may help keep symptoms at bay:  Keep the pet out of your bedroom and restrict it to only a few rooms. Be advised that keeping the dog or cat in only one room will not limit the allergens to that room. Don't pet, hug or kiss the dog or cat; if you do, wash your hands with soap and water. High-efficiency particulate air (HEPA) cleaners run continuously in a bedroom or living room can reduce allergen levels over time. Regular use of a high-efficiency vacuum cleaner or a central vacuum can reduce allergen levels. Giving your dog or cat a bath at least once a week can reduce airborne allergen.  Control of Cockroach Allergen Cockroach allergen has been identified as an important cause of acute attacks of asthma, especially in urban settings.  There are fifty-five species of cockroach that exist in the [place], however only three, the [place], [place] species produce allergen that can affect patients with Asthma.  Allergens can be obtained from fecal particles, egg casings and secretions from cockroaches.    Remove food sources. Reduce access to water. Seal access and entry points. Spray runways with 0.5-1% Diazinon or Chlorpyrifos Blow boric acid power under stoves and refrigerator. Place bait stations (hydramethylnon) at feeding sites.    Control of Dust Mite Allergen Dust mites play a major role in allergic asthma and rhinitis. They occur in environments with high humidity wherever human skin is found. Dust mites absorb humidity from the atmosphere (ie, they do not drink) and feed on organic matter (including shed human and animal skin). Dust mites are a microscopic type of insect that you cannot see with the naked eye. High levels of dust mites have been detected from mattresses, pillows, carpets, upholstered furniture, bed covers, clothes, soft toys and  any woven material. The principal allergen of the dust mite is found in its feces. A gram of dust may contain 1,000 mites and 250,000 fecal particles. Mite antigen is easily measured in the air during house cleaning activities. Dust mites do not bite and do not cause harm to humans, other than by triggering allergies/asthma.  Ways to decrease your exposure to dust mites in your home:  1. Encase mattresses, box springs and pillows with a mite-impermeable barrier or cover  2. Wash sheets, blankets and drapes weekly in hot water (130 F) with detergent and dry them in a dryer on the hot setting.  3. Have the room cleaned frequently with a vacuum cleaner and a damp dust-mop. For carpeting or rugs, vacuuming with a vacuum cleaner equipped with a high-efficiency particulate air (HEPA) filter. The dust mite allergic individual should not be in a room which is being cleaned and should wait 1 hour after cleaning before going into the room.  4. Do not sleep on upholstered furniture (eg, couches).  5. If possible removing carpeting, upholstered furniture and drapery from the home is ideal. Horizontal blinds should be eliminated in the rooms  where the person spends the most time (bedroom, study, television room). Washable vinyl, roller-type shades are optimal.  6. Remove all non-washable stuffed toys from the bedroom. Wash stuffed toys weekly like sheets and blankets above.  7. Reduce indoor humidity to less than 50%. Inexpensive humidity monitors can be purchased at most hardware stores. Do not use a humidifier as can make the problem worse and are not recommended.

## 2023-10-05 NOTE — Progress Notes (Signed)
[address] [place] [address] Dept: [phone]  FOLLOW UP NOTE  Patient ID: Sara Hayes, female    DOB: [birth year]  Age: 52 y.o. MRN: [id number] Date of Office Visit: 10/05/2023  Assessment  Chief Complaint: Asthma (For the last week, she's been waking up to immediately use her inhaler, nasal spray, and Arnuity. Has a lot of pain when she tries to breathe deeply and believes it might be because of the weather.) and Other (Nebulizer machine given in office today.)  HPI Sara Hayes is a 52 year old female who presents to the clinic for evaluation of asthma.  She was last seen in this clinic on 09/21/2023 by Dr. Maurine Minister for evaluation of asthma, allergic rhinitis on allergen immunotherapy, allergic conjunctivitis, and contact dermatitis.    At today's visit, she reports her asthma has been poorly controlled for the last 5 days with symptoms including chest tightness in the morning, shortness of breath with rest and activity, and dry cough occurring in the daytime and occasionally overnight.  She continues Arnuity 200-1 puff once a day and has used albuterol 2-3 times a day over the last 5 days.  She continues montelukast 3 days out of the week.  She denies fever, sweats, chills, or sick contacts.  She denies symptoms of reflux including heartburn or vomiting.  She reports that she has used her son's albuterol via nebulizer with relief of symptoms.  Allergic rhinitis is reported as moderately well-controlled with occasional clear rhinorrhea and nasal congestion as the main symptoms.  She continues cetirizine 2 times a day and is using Flonase 3-4 times a day.  She is not currently using a nasal saline rinse.  She began allergen immunotherapy directed toward, grass pollen, mold, cockroach, and dust mite on 02/11/2022.  She continues allergen immunotherapy with no large or local reactions.  She reports a significant decrease in her symptoms of allergic rhinitis while continuing on  allergen immunotherapy.  Allergic conjunctivitis is reported as well-controlled with rare red or itchy eyes.  Her current medications are listed in the chart.   Drug Allergies:  Allergies  Allergen Reactions   Amoxicillin Hives   Penicillins Hives    Has patient had a PCN reaction causing immediate rash, facial/tongue/throat swelling, SOB or lightheadedness with hypotension: YES Has patient had a PCN reaction causing severe rash involving mucus membranes or skin necrosis: yes Has patient had a PCN reaction that required hospitalization: no Has patient had a PCN reaction occurring within the last 10 years: no If all of the above answers are "NO", then may proceed with Cephalosporin use.    Aspirin Hives   Latex Hives and Other (See Comments)    Reaction Type: Allergy; Severity: Moderate; Reaction(s): reddened skin    Naproxen Other (See Comments)    Unknown, large blood clots   Niacin And Related Other (See Comments)    flushing    Physical Exam: BP 102/80 (BP Location: Right Arm, Patient Position: Sitting, Cuff Size: Normal)   Pulse 64   Temp 97.6 F (36.4 C) (Temporal)   Resp 14   Ht 5\' 5"  (1.651 m)   Wt 212 lb 8 oz (96.4 kg)   SpO2 99%   BMI 35.36 kg/m    Physical Exam Vitals reviewed.  Constitutional:      Appearance: Normal appearance.  HENT:     Head: Normocephalic and atraumatic.     Right Ear: Tympanic membrane normal.     Left Ear: Tympanic membrane normal.  Nose:     Comments: Bilateral nares slightly erythematous with thin clear nasal drainage noted.  Pharynx normal.  Ears normal.  Eyes normal.    Mouth/Throat:     Pharynx: Oropharynx is clear.  Eyes:     Conjunctiva/sclera: Conjunctivae normal.  Cardiovascular:     Rate and Rhythm: Normal rate and regular rhythm.     Heart sounds: Normal heart sounds. No murmur heard. Pulmonary:     Effort: Pulmonary effort is normal.     Breath sounds: Normal breath sounds.     Comments: Limited airflow noted.   No wheezing or rhonchi noted Musculoskeletal:        General: Normal range of motion.     Cervical back: Normal range of motion and neck supple.  Skin:    General: Skin is warm and dry.  Neurological:     Mental Status: She is alert and oriented to person, place, and time.  Psychiatric:        Mood and Affect: Mood normal.        Behavior: Behavior normal.        Thought Content: Thought content normal.        Judgment: Judgment normal.    Diagnostics: Spirometry deferred due to recent chest tightness  Assessment and Plan: 1. Not well controlled moderate persistent asthma   2. Seasonal and perennial allergic rhinitis   3. Allergic conjunctivitis of both eyes     Meds ordered this encounter  Medications   ipratropium-albuterol (DUONEB) 0.5-2.5 (3) MG/3ML nebulizer solution 3 mL    Patient Instructions  Asthma Begin albuterol followed by budesonide via nebulizer twice a day for the next 2 weeks. This will replace Arnuity for now Continue montelukast 10 mg once a day to prevent cough or wheeze Continue albuterol 2 puffs once every 4 hours if needed for cough or wheeze You may use albuterol 2 puffs 5 to 15 minutes before activity to decrease cough or wheeze Call the clinic if your breathing worsens.  Go to the ED if you are in respiratory distress  Allergic rhinitis Continue allergen avoidance measures directed toward grass pollen, molds, cat, cockroach, and dust mite as listed below Continue an antihistamine once a day as needed for runny nose or itch. Remember to rotate to a different antihistamine about every 3 months. Some examples of over the counter antihistamines include Zyrtec (cetirizine), Xyzal (levocetirizine), Allegra (fexofenadine), and Claritin (loratidine).  Continue Flonase 1 sprays in each nostril twice a day as needed for stuffy nose Continue azelastine 2 sprays in each also up to twice a day as needed for runny nose Begin saline nasal rinses as needed for  nasal symptoms. Use this before any medicated nasal sprays for best result Continue allergen immunotherapy and have access to an epinephrine autoinjector set. Do not get an injection until your breathing is back at baseline  Allergic conjunctivitis Some over the counter eye drops include Pataday one drop in each eye once a day as needed for red, itchy eyes OR Zaditor one drop in each eye twice a day as needed for red itchy eyes. Avoid eye drops that say red eye relief as they may contain medications that dry out your eyes.   Call the clinic if this treatment plan is not working well for you.  Follow up in 2 weeks or sooner if needed.   Return in about 2 weeks (around 10/19/2023), or if symptoms worsen or fail to improve.    Thank you for the  opportunity to care for this patient.  Please do not hesitate to contact me with questions.  Thermon Leyland, FNP [facility]

## 2023-10-06 ENCOUNTER — Telehealth: Payer: Self-pay

## 2023-10-06 NOTE — Telephone Encounter (Signed)
Spoke with pt she states that she is felling better since starting the medication regiment she was given.No concerns or questions.

## 2023-10-08 NOTE — Telephone Encounter (Signed)
 Thank you :)

## 2023-10-13 ENCOUNTER — Ambulatory Visit (INDEPENDENT_AMBULATORY_CARE_PROVIDER_SITE_OTHER): Payer: 59 | Admitting: *Deleted

## 2023-10-13 DIAGNOSIS — J309 Allergic rhinitis, unspecified: Secondary | ICD-10-CM

## 2023-10-19 ENCOUNTER — Ambulatory Visit (INDEPENDENT_AMBULATORY_CARE_PROVIDER_SITE_OTHER): Payer: 59 | Admitting: Family Medicine

## 2023-10-19 ENCOUNTER — Ambulatory Visit (INDEPENDENT_AMBULATORY_CARE_PROVIDER_SITE_OTHER): Payer: 59

## 2023-10-19 ENCOUNTER — Encounter: Payer: Self-pay | Admitting: Family Medicine

## 2023-10-19 ENCOUNTER — Other Ambulatory Visit: Payer: Self-pay

## 2023-10-19 VITALS — BP 102/64 | HR 78 | Temp 97.6°F | Resp 16

## 2023-10-19 DIAGNOSIS — J302 Other seasonal allergic rhinitis: Secondary | ICD-10-CM | POA: Diagnosis not present

## 2023-10-19 DIAGNOSIS — J3089 Other allergic rhinitis: Secondary | ICD-10-CM

## 2023-10-19 DIAGNOSIS — J309 Allergic rhinitis, unspecified: Secondary | ICD-10-CM | POA: Diagnosis not present

## 2023-10-19 DIAGNOSIS — J4540 Moderate persistent asthma, uncomplicated: Secondary | ICD-10-CM

## 2023-10-19 DIAGNOSIS — H1013 Acute atopic conjunctivitis, bilateral: Secondary | ICD-10-CM | POA: Diagnosis not present

## 2023-10-19 NOTE — Progress Notes (Signed)
 [address] [place] [address] Dept: [phone]  FOLLOW UP NOTE  Patient ID: Sara Hayes, female    DOB: 12-28-1970  Age: 53 y.o. MRN: [id number] Date of Office Visit: 10/19/2023  Assessment  Chief Complaint: Asthma (She says she thinks it's getting better but she hasn't been doing too much.) and Allergic Rhinitis  (Says that she thinks that they're getting back to where they're supposed to be.)  HPI Sara Hayes is a 53 year old female who presents to the clinic for follow-up visit.  She was last seen in this clinic on 10/05/2023 by Arlean Mutter, FNP, for evaluation of not well-controlled asthma, allergic rhinitis, and allergic conjunctivitis.  Problem list current for MS. She does report two falls occurring within the last several days. She denies hitting her head during either fall.  At today's visit, she reports that her asthma has been more well controlled since her last visit, however, she continues to experience random episodes of shortness of breath. She denies wheeze and cough with activity or rest. At her last visit, she was switched from Arnuity to budesonide  via inhaler as she was having difficulty activating the Arnuity inhaler with a breath in. She reports that she is frequently confused as to if she has taken her albuterol  via nebulizer or if she has taken budesonide  via nebulizer.   Allergic rhinitis is reported as moderately well controlled with clear rhinorrhea as the main symptom. She denies nasal congestion, sneezing and post nasal drainage. She continues an antihistamine and Flonase  as needed with relief of symptoms. She began allergen immunotherapy directed toward, grass pollen, mold, cockroach, and dust mite on 02/11/2022.  She continues allergen immunotherapy with no large or local reactions. She reports a significant decrease in her symptoms of allergic rhinitis while continuing on allergen immunotherapy.   Allergic conjunctivitis is reported as moderately  well controlled with occasional red and itchy eyes for which she uses a olopatadine  as needed with relief of symptoms.   She denies recent bug bites and no reactions.   Her current medications are listed in the chart.   Drug Allergies:  Allergies  Allergen Reactions   Amoxicillin  Hives   Penicillins Hives    Has patient had a PCN reaction causing immediate rash, facial/tongue/throat swelling, SOB or lightheadedness with hypotension: YES Has patient had a PCN reaction causing severe rash involving mucus membranes or skin necrosis: yes Has patient had a PCN reaction that required hospitalization: no Has patient had a PCN reaction occurring within the last 10 years: no If all of the above answers are NO, then may proceed with Cephalosporin use.    Aspirin Hives   Latex Hives and Other (See Comments)    Reaction Type: Allergy; Severity: Moderate; Reaction(s): reddened skin    Naproxen Other (See Comments)    Unknown, large blood clots   Niacin And Related Other (See Comments)    flushing    Physical Exam: BP 102/64 (BP Location: Right Arm, Patient Position: Sitting, Cuff Size: Normal)   Pulse 78   Temp 97.6 F (36.4 C) (Temporal)   Resp 16   SpO2 98%    Physical Exam Vitals reviewed.  Constitutional:      Appearance: Normal appearance.  HENT:     Head: Normocephalic and atraumatic.     Right Ear: Tympanic membrane normal.     Left Ear: Tympanic membrane normal.     Nose:     Comments: Bilateral nares normal. Pharynx normal. Ears normal. Eyes  normal.    Mouth/Throat:     Pharynx: Oropharynx is clear.  Eyes:     Conjunctiva/sclera: Conjunctivae normal.  Cardiovascular:     Rate and Rhythm: Normal rate and regular rhythm.     Heart sounds: Normal heart sounds. No murmur heard. Pulmonary:     Effort: Pulmonary effort is normal.     Breath sounds: Normal breath sounds.     Comments: Lungs clear to auscultation Musculoskeletal:     Cervical back: Normal range of  motion and neck supple.  Skin:    General: Skin is warm and dry.  Neurological:     Mental Status: She is alert and oriented to person, place, and time.  Psychiatric:        Mood and Affect: Mood normal.        Behavior: Behavior normal.        Thought Content: Thought content normal.        Judgment: Judgment normal.     Diagnostics: FVC 2.53 which is 83% of predicted value, FEV1 2.02 which is 83% of predicted value.  Spirometry indicates normal ventilatory function.  Assessment and Plan: 1. Not well controlled moderate persistent asthma   2. Seasonal and perennial allergic rhinitis   3. Allergic conjunctivitis of both eyes     Patient Instructions  Asthma Continue budesonide  via nebulizer twice a day to control asthma symptoms Continue montelukast  10 mg once a day to prevent cough or wheeze Continue albuterol  2 puffs once every 4 hours if needed for cough or wheeze You may use albuterol  2 puffs 5 to 15 minutes before activity to decrease cough or wheeze Call the clinic if your breathing worsens.  Go to the ED if you are in respiratory distress  Allergic rhinitis Continue allergen avoidance measures directed toward grass pollen, molds, cat, cockroach, and dust mite as listed below Continue an antihistamine once a day as needed for runny nose or itch. Remember to rotate to a different antihistamine about every 3 months. Some examples of over the counter antihistamines include Zyrtec (cetirizine), Xyzal  (levocetirizine), Allegra (fexofenadine), and Claritin (loratidine).  Continue Flonase  1 sprays in each nostril twice a day as needed for stuffy nose Continue azelastine  2 sprays in each also up to twice a day as needed for runny nose Begin saline nasal rinses as needed for nasal symptoms. Use this before any medicated nasal sprays for best result Continue allergen immunotherapy and have access to an epinephrine  autoinjector set. Do not get an injection until your breathing is back  at baseline  Allergic conjunctivitis Some over the counter eye drops include Pataday  one drop in each eye once a day as needed for red, itchy eyes OR Zaditor one drop in each eye twice a day as needed for red itchy eyes. Avoid eye drops that say red eye relief as they may contain medications that dry out your eyes.   Call the clinic if this treatment plan is not working well for you.  Follow up in 1 month or sooner if needed.   Return in about 4 weeks (around 11/16/2023).    Thank you for the opportunity to care for this patient.  Please do not hesitate to contact me with questions.  Arlean Mutter, FNP [facility] 

## 2023-10-19 NOTE — Patient Instructions (Addendum)
 Asthma Continue budesonide  via nebulizer twice a day to control asthma symptoms Continue montelukast  10 mg once a day to prevent cough or wheeze Continue albuterol  2 puffs once every 4 hours if needed for cough or wheeze You may use albuterol  2 puffs 5 to 15 minutes before activity to decrease cough or wheeze Call the clinic if your breathing worsens.  Go to the ED if you are in respiratory distress  Allergic rhinitis Continue allergen avoidance measures directed toward grass pollen, molds, cat, cockroach, and dust mite as listed below Continue an antihistamine once a day as needed for runny nose or itch. Remember to rotate to a different antihistamine about every 3 months. Some examples of over the counter antihistamines include Zyrtec (cetirizine), Xyzal  (levocetirizine), Allegra (fexofenadine), and Claritin (loratidine).  Continue Flonase  1 sprays in each nostril twice a day as needed for stuffy nose Continue azelastine  2 sprays in each also up to twice a day as needed for runny nose Begin saline nasal rinses as needed for nasal symptoms. Use this before any medicated nasal sprays for best result Continue allergen immunotherapy and have access to an epinephrine  autoinjector set. Do not get an injection until your breathing is back at baseline  Allergic conjunctivitis Some over the counter eye drops include Pataday  one drop in each eye once a day as needed for red, itchy eyes OR Zaditor one drop in each eye twice a day as needed for red itchy eyes. Avoid eye drops that say red eye relief as they may contain medications that dry out your eyes.   Call the clinic if this treatment plan is not working well for you.  Follow up in 1 month or sooner if needed.  Reducing Pollen Exposure The American Academy of Allergy, Asthma and Immunology suggests the following steps to reduce your exposure to pollen during allergy seasons. Do not hang sheets or clothing out to dry; pollen may collect on these  items. Do not mow lawns or spend time around freshly cut grass; mowing stirs up pollen. Keep windows closed at night.  Keep car windows closed while driving. Minimize morning activities outdoors, a time when pollen counts are usually at their highest. Stay indoors as much as possible when pollen counts or humidity is high and on windy days when pollen tends to remain in the air longer. Use air conditioning when possible.  Many air conditioners have filters that trap the pollen spores. Use a HEPA room air filter to remove pollen form the indoor air you breathe.  Control of Mold Allergen Mold and fungi can grow on a variety of surfaces provided certain temperature and moisture conditions exist.  Outdoor molds grow on plants, decaying vegetation and soil.  The major outdoor mold, Alternaria and Cladosporium, are found in very high numbers during hot and dry conditions.  Generally, a late Summer - Fall peak is seen for common outdoor fungal spores.  Rain will temporarily lower outdoor mold spore count, but counts rise rapidly when the rainy period ends.  The most important indoor molds are Aspergillus and Penicillium.  Dark, humid and poorly ventilated basements are ideal sites for mold growth.  The next most common sites of mold growth are the bathroom and the kitchen.  Outdoor [employer] Use air conditioning and keep windows closed Avoid exposure to decaying vegetation. Avoid leaf raking. Avoid grain handling. Consider wearing a face mask if working in moldy areas.  Indoor Mold Control Maintain humidity below 50%. Clean washable surfaces with 5% bleach  solution. Remove sources e.g. Contaminated carpets.  Control of Dog or Cat Allergen Avoidance is the best way to manage a dog or cat allergy. If you have a dog or cat and are allergic to dog or cats, consider removing the dog or cat from the home. If you have a dog or cat but don't want to find it a new home, or if your family wants a pet even  though someone in the household is allergic, here are some strategies that may help keep symptoms at bay:  Keep the pet out of your bedroom and restrict it to only a few rooms. Be advised that keeping the dog or cat in only one room will not limit the allergens to that room. Don't pet, hug or kiss the dog or cat; if you do, wash your hands with soap and water. High-efficiency particulate air (HEPA) cleaners run continuously in a bedroom or living room can reduce allergen levels over time. Regular use of a high-efficiency vacuum cleaner or a central vacuum can reduce allergen levels. Giving your dog or cat a bath at least once a week can reduce airborne allergen.  Control of Cockroach Allergen Cockroach allergen has been identified as an important cause of acute attacks of asthma, especially in urban settings.  There are fifty-five species of cockroach that exist in the [place] , however only three, the American, German and Oriental species produce allergen that can affect patients with Asthma.  Allergens can be obtained from fecal particles, egg casings and secretions from cockroaches.    Remove food sources. Reduce access to water. Seal access and entry points. Spray runways with 0.5-1% Diazinon or Chlorpyrifos Blow boric acid power under stoves and refrigerator. Place bait stations (hydramethylnon) at feeding sites.    Control of Dust Mite Allergen Dust mites play a major role in allergic asthma and rhinitis. They occur in environments with high humidity wherever human skin is found. Dust mites absorb humidity from the atmosphere (ie, they do not drink) and feed on organic matter (including shed human and animal skin). Dust mites are a microscopic type of insect that you cannot see with the naked eye. High levels of dust mites have been detected from mattresses, pillows, carpets, upholstered furniture, bed covers, clothes, soft toys and any woven material. The principal allergen of the  dust mite is found in its feces. A gram of dust may contain 1,000 mites and 250,000 fecal particles. Mite antigen is easily measured in the air during house cleaning activities. Dust mites do not bite and do not cause harm to humans, other than by triggering allergies/asthma.  Ways to decrease your exposure to dust mites in your home:  1. Encase mattresses, box springs and pillows with a mite-impermeable barrier or cover  2. Wash sheets, blankets and drapes weekly in hot water (130 F) with detergent and dry them in a dryer on the hot setting.  3. Have the room cleaned frequently with a vacuum cleaner and a damp dust-mop. For carpeting or rugs, vacuuming with a vacuum cleaner equipped with a high-efficiency particulate air (HEPA) filter. The dust mite allergic individual should not be in a room which is being cleaned and should wait 1 hour after cleaning before going into the room.  4. Do not sleep on upholstered furniture (eg, couches).  5. If possible removing carpeting, upholstered furniture and drapery from the home is ideal. Horizontal blinds should be eliminated in the rooms where the person spends the most time (bedroom, study, television  room). Washable vinyl, roller-type shades are optimal.  6. Remove all non-washable stuffed toys from the bedroom. Wash stuffed toys weekly like sheets and blankets above.  7. Reduce indoor humidity to less than 50%. Inexpensive humidity monitors can be purchased at most hardware stores. Do not use a humidifier as can make the problem worse and are not recommended.

## 2023-10-24 ENCOUNTER — Other Ambulatory Visit: Payer: Self-pay | Admitting: Internal Medicine

## 2023-10-26 MED ORDER — PROAIR RESPICLICK 108 (90 BASE) MCG/ACT IN AEPB: 2.0000 | INHALATION_SPRAY | RESPIRATORY_TRACT | 1 refills | Status: DC | PRN

## 2023-10-26 NOTE — Telephone Encounter (Signed)
PRO AIR RESPICLICK  has been sent in to the patient's pharmacy

## 2023-10-26 NOTE — Addendum Note (Signed)
Addended by: Orson Aloe on: 10/26/2023 04:36 PM   Modules accepted: Orders

## 2023-10-26 NOTE — Telephone Encounter (Signed)
Sara Hayes came into the office and states she needs the PRO AIR RESPICLICK and not the Ventolin.  Sara Hayes states she has arthritis in her hands and can't use HFA inhalers.  She would like PRO AIR RESPICLICK called in to [facility] on [address]

## 2023-10-27 ENCOUNTER — Ambulatory Visit
Admission: RE | Admit: 2023-10-27 | Discharge: 2023-10-27 | Disposition: A | Discharge status: Home / Self Care | Payer: 59 | Source: Ambulatory Visit | Attending: Internal Medicine | Admitting: Internal Medicine

## 2023-10-27 DIAGNOSIS — Z1231 Encounter for screening mammogram for malignant neoplasm of breast: Secondary | ICD-10-CM | POA: Diagnosis not present

## 2023-11-05 DIAGNOSIS — J45998 Other asthma: Secondary | ICD-10-CM | POA: Diagnosis not present

## 2023-11-17 ENCOUNTER — Ambulatory Visit (INDEPENDENT_AMBULATORY_CARE_PROVIDER_SITE_OTHER): Payer: 59 | Admitting: *Deleted

## 2023-11-17 DIAGNOSIS — J309 Allergic rhinitis, unspecified: Secondary | ICD-10-CM

## 2023-11-18 ENCOUNTER — Other Ambulatory Visit: Payer: Self-pay | Admitting: Internal Medicine

## 2023-11-24 DIAGNOSIS — Z96643 Presence of artificial hip joint, bilateral: Secondary | ICD-10-CM | POA: Diagnosis not present

## 2023-11-24 DIAGNOSIS — M0600 Rheumatoid arthritis without rheumatoid factor, unspecified site: Secondary | ICD-10-CM | POA: Diagnosis not present

## 2023-11-24 DIAGNOSIS — M5416 Radiculopathy, lumbar region: Secondary | ICD-10-CM | POA: Diagnosis not present

## 2023-11-24 DIAGNOSIS — M3500 Sicca syndrome, unspecified: Secondary | ICD-10-CM | POA: Diagnosis not present

## 2023-11-24 DIAGNOSIS — J309 Allergic rhinitis, unspecified: Secondary | ICD-10-CM | POA: Diagnosis not present

## 2023-11-24 DIAGNOSIS — M0880 Other juvenile arthritis, unspecified site: Secondary | ICD-10-CM | POA: Diagnosis not present

## 2023-11-24 DIAGNOSIS — M169 Osteoarthritis of hip, unspecified: Secondary | ICD-10-CM | POA: Diagnosis not present

## 2023-11-29 NOTE — Progress Notes (Unsigned)
 FOLLOW UP Date of Service/Encounter:  11/30/23  Subjective:  Sara Hayes (DOB: 15-Apr-1971) is a 53 y.o. female who [facility] in re-evaluation of the following: asthma, allergic rhinitis, and allergic conjunctivitis  History obtained from: chart review and patient.  For Review, LV was on 10/19/23  with Thermon Leyland, FNP seen for routine follow-up. See below for summary of history and diagnostics.   Therapeutic plans/changes recommended: asthma had not been controlled and she was switched to budesonide via nebulizer BID ----------------------------------------------------- Pertinent History/Diagnostics:  Asthma:  Moderate persistent.  No history of hospitalizations, no steroids in the past year.  Uses rescue inhaler around once a week. Has a difficult time using HFA inhalers due to arthritis. - spirometry (01/08/2022): ratio 102%, 87% FEV1 -Prior PFTs or spirometry: 2015-normal pulmonary function test with ratio of 90% and FEV1 of 101%- - Most Recent AEC (10/14/21): 200 Current Meds: budesonide 0.5 mg BID (switched from Arnuity 200 09/2023), ProAir Respiclick as needed Allergic Rhinitis:  Moved from [place] to [place] in 2012.  Uncontrolled with daily antihistamine, Flonase, and Singulair daily.Uses restasis for dry eyes.  Follows with ophthalmologist.  Last appointment was 2022 in the Spring.  Started AIT 02/11/2022.  Receiving Vial 1 (grass, dust mite, cat) vial 2 (cockroach and molds). Reached maintenance dose of 0.5 mL red vial on 08/20/22. - SPT environmental panel (01/08/2022): positive to cats, intradermals positive to [place] grass, mold mix 1, 2, 3 and 4, cockroach, mite mix Current meds: Flonase, azelastine nasal spray, Singulair, oral OTC AH, OTC allergy eyedrops Hymenoptera allergy   - Hx of reaction: Developed a local reaction but then became lightheaded, no syncope, but did require emergency room treatment. -IgE negative to  honeybee, white hornet, yellow jacket, paper wasp, yellow hornet, bumblebee, tryptase 3.5 (normal baseline).   - skin testing 04/22/22: negative to honeybee, yellow jacket, yellow hornet, white hornet, and wasp Current Meds: EpiPen as needed. 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  Testing followed by challenge if indicated offered. Other: has Multiple sclerosis and seronegative RA, currently not on any therapy. --------------------------------------------------- Today presents for follow-up. Discussed the use of AI scribe software for clinical note transcription with the patient, who gave verbal consent to proceed.  History of Present Illness   Sara Hayes is a 53 year old female with asthma who presents with worsening asthma symptoms.  She is experiencing increased coughing upon waking and difficulty breathing, prompting her to seek medical attention. She has been experiencing mild obstruction in her breathing.  She has been using a nebulizer inconsistently, approximately every two weeks, and typically uses her albuterol inhaler as needed, about every two days. She has been using Arnuity 1 puff daily since last visit, and had been using both Arnuity and pulmicort via nebulizer BID, but recently switched back to Arnuity alone. She experiences shaking after using albuterol, requiring her to sit down until the shaking subsides. She continues to take montelukast and receives allergy shots monthly, with her next appointment scheduled for the thirteenth. However, as of this morning, feels her asthma is again not at goal.  She wonders if she is using the Arnuity correctly.  She is not sure when to use the pulmicort.   She attributes some of her asthma flares to changes in weather and exposure to cats near her home.  She mentions that she is juicing and taking vitamins as part of her routine.  Chart Review: Last allergy injection  11/17/23-received 0.5 mL coming every 4 weeks on both vials FU Rheum 11/24/23 for seronegaitve RA; off DMARDS, recommended she reach out to neurology for FU as she is also off MS meds  All medications reviewed by clinical staff and updated in chart. No new pertinent medical or surgical history except as noted in HPI.  ROS: All others negative except as noted per HPI.   Objective:  BP 108/68 (BP Location: Right Arm, Patient Position: Sitting, Cuff Size: Normal)   Pulse 66   Temp 97.6 F (36.4 C) (Temporal)   Ht 5' 4.61" (1.641 m)   Wt 214 lb 14.4 oz (97.5 kg)   SpO2 99%   BMI 36.20 kg/m  Body mass index is 36.2 kg/m. Physical Exam: General Appearance:  Alert, cooperative, no distress, appears stated age  Head:  Normocephalic, without obvious abnormality, atraumatic  Eyes:  Conjunctiva clear, EOM's intact  Ears EACs normal bilaterally and normal TMs bilaterally  Nose: Nares normal, hypertrophic turbinates, normal mucosa, and no visible anterior polyps  Throat: Lips, tongue normal; teeth and gums normal, normal posterior oropharynx  Neck: Supple, symmetrical  Lungs:   clear to auscultation bilaterally, Respirations unlabored, no coughing  Heart:  regular rate and rhythm and no murmur, Appears well perfused  Extremities: No edema  Skin: Skin color, texture, turgor normal and no rashes or lesions on visualized portions of skin  Neurologic: No gross deficits   Labs:  Lab Orders  No laboratory test(s) ordered today    Spirometry:  Tracings reviewed. Her effort: Good reproducible efforts. FVC: 2.81L FEV1: 1.72L, 71% predicted FEV1/FVC ratio: 0.61 Interpretation: Spirometry consistent with mild obstructive disease.  Please see scanned spirometry results for details.  Assessment/Plan   Asthma-not at goal Spirometry showing mild obstruction today. Start Trelegy 200 mcg 1 puffs daily. Rinse mouth after use. During asthma flares/respiratory illness: add budesonide via nebulizer  twice a day to control asthma symptoms. Use for 1-2 weeks or until symptoms resolve. Rinse mouth out after use. Continue montelukast 10 mg once a day to prevent cough or wheeze Continue albuterol 2 puffs once every 4 hours if needed for cough or wheeze You may use albuterol 2 puffs 5 to 15 minutes before activity to decrease cough or wheeze Call the clinic if your breathing worsens.  Go to the ED if you are in respiratory distress  Allergic rhinitis-at goal Continue allergen avoidance measures directed toward grass pollen, molds, cat, cockroach, and dust mite as listed below Continue an antihistamine once a day as needed for runny nose or itch. Remember to rotate to a different antihistamine about every 3 months. Some examples of over the counter antihistamines include Zyrtec (cetirizine), Xyzal (levocetirizine), Allegra (fexofenadine), and Claritin (loratidine).  Continue Flonase 1 sprays in each nostril twice a day as needed for stuffy nose Continue azelastine 2 sprays in each also up to twice a day as needed for runny nose Begin saline nasal rinses as needed for nasal symptoms. Use this before any medicated nasal sprays for best result Continue allergen immunotherapy and have access to an epinephrine autoinjector set. Do not get an injection until your breathing is back at baseline  Allergic conjunctivitis-at goal Some over the counter eye drops include Pataday one drop in each eye once a day as needed for red, itchy eyes OR Zaditor one drop in each eye twice a day as needed for red itchy eyes. Avoid eye drops that say red eye relief as they may contain  medications that dry out your eyes.   Call the clinic if this treatment plan is not working well for you.  Other: Samples provided of Trelegy 200 in clinic today (2)  Follow up in 3 months or sooner if needed. It was a pleasure seeing you again in clinic today! Thank you for allowing me to participate in your care.  Tonny Bollman, MD   [facility]

## 2023-11-30 ENCOUNTER — Encounter: Payer: Self-pay | Admitting: Internal Medicine

## 2023-11-30 ENCOUNTER — Other Ambulatory Visit: Payer: Self-pay

## 2023-11-30 ENCOUNTER — Ambulatory Visit (INDEPENDENT_AMBULATORY_CARE_PROVIDER_SITE_OTHER): Payer: 59 | Admitting: Internal Medicine

## 2023-11-30 VITALS — BP 108/68 | HR 66 | Temp 97.6°F | Ht 64.61 in | Wt 214.9 lb

## 2023-11-30 DIAGNOSIS — J302 Other seasonal allergic rhinitis: Secondary | ICD-10-CM | POA: Diagnosis not present

## 2023-11-30 DIAGNOSIS — H1013 Acute atopic conjunctivitis, bilateral: Secondary | ICD-10-CM

## 2023-11-30 DIAGNOSIS — J4540 Moderate persistent asthma, uncomplicated: Secondary | ICD-10-CM | POA: Diagnosis not present

## 2023-11-30 DIAGNOSIS — J3089 Other allergic rhinitis: Secondary | ICD-10-CM | POA: Diagnosis not present

## 2023-11-30 MED ORDER — BUDESONIDE 0.5 MG/2ML IN SUSP: RESPIRATORY_TRACT | 1 refills | Status: DC

## 2023-11-30 MED ORDER — TRELEGY ELLIPTA 200-62.5-25 MCG/ACT IN AEPB
1.0000 | INHALATION_SPRAY | Freq: Every day | RESPIRATORY_TRACT | 5 refills | Status: DC
Start: 2023-11-30 — End: 2024-03-07

## 2023-11-30 MED ORDER — ALBUTEROL SULFATE (2.5 MG/3ML) 0.083% IN NEBU: 2.5000 mg | INHALATION_SOLUTION | RESPIRATORY_TRACT | 1 refills | Status: DC | PRN

## 2023-11-30 MED ORDER — ALBUTEROL SULFATE HFA 108 (90 BASE) MCG/ACT IN AERS: 2.0000 | INHALATION_SPRAY | RESPIRATORY_TRACT | 1 refills | Status: DC | PRN

## 2023-11-30 NOTE — Patient Instructions (Addendum)
 Asthma Spirometry showing mild obstruction today. Start Trelegy 200 mcg 1 puffs daily. Rinse mouth after use. During asthma flares/respiratory illness: add budesonide via nebulizer twice a day to control asthma symptoms. Use for 1-2 weeks or until symptoms resolve. Rinse mouth out after use. Continue montelukast 10 mg once a day to prevent cough or wheeze Continue albuterol 2 puffs once every 4 hours if needed for cough or wheeze You may use albuterol 2 puffs 5 to 15 minutes before activity to decrease cough or wheeze Call the clinic if your breathing worsens.  Go to the ED if you are in respiratory distress  Allergic rhinitis Continue allergen avoidance measures directed toward grass pollen, molds, cat, cockroach, and dust mite as listed below Continue an antihistamine once a day as needed for runny nose or itch. Remember to rotate to a different antihistamine about every 3 months. Some examples of over the counter antihistamines include Zyrtec (cetirizine), Xyzal (levocetirizine), Allegra (fexofenadine), and Claritin (loratidine).  Continue Flonase 1 sprays in each nostril twice a day as needed for stuffy nose Continue azelastine 2 sprays in each also up to twice a day as needed for runny nose Begin saline nasal rinses as needed for nasal symptoms. Use this before any medicated nasal sprays for best result Continue allergen immunotherapy and have access to an epinephrine autoinjector set. Do not get an injection until your breathing is back at baseline  Allergic conjunctivitis Some over the counter eye drops include Pataday one drop in each eye once a day as needed for red, itchy eyes OR Zaditor one drop in each eye twice a day as needed for red itchy eyes. Avoid eye drops that say red eye relief as they may contain medications that dry out your eyes.   Call the clinic if this treatment plan is not working well for you.  Follow up in 3 months or sooner if needed. It was a pleasure seeing  you again in clinic today! Thank you for allowing me to participate in your care.  Tonny Bollman, MD [facility]   Reducing Pollen Exposure The American Academy of Allergy, Asthma and Immunology suggests the following steps to reduce your exposure to pollen during allergy seasons. Do not hang sheets or clothing out to dry; pollen may collect on these items. Do not mow lawns or spend time around freshly cut grass; mowing stirs up pollen. Keep windows closed at night.  Keep car windows closed while driving. Minimize morning activities outdoors, a time when pollen counts are usually at their highest. Stay indoors as much as possible when pollen counts or humidity is high and on windy days when pollen tends to remain in the air longer. Use air conditioning when possible.  Many air conditioners have filters that trap the pollen spores. Use a HEPA room air filter to remove pollen form the indoor air you breathe.  Control of Mold Allergen Mold and fungi can grow on a variety of surfaces provided certain temperature and moisture conditions exist.  Outdoor molds grow on plants, decaying vegetation and soil.  The major outdoor mold, Alternaria and Cladosporium, are found in very high numbers during hot and dry conditions.  Generally, a late Summer - Fall peak is seen for common outdoor fungal spores.  Rain will temporarily lower outdoor mold spore count, but counts rise rapidly when the rainy period ends.  The most important indoor molds are Aspergillus and Penicillium.  Dark, humid and poorly ventilated basements are ideal sites  for mold growth.  The next most common sites of mold growth are the bathroom and the kitchen.  Outdoor [employer] Use air conditioning and keep windows closed Avoid exposure to decaying vegetation. Avoid leaf raking. Avoid grain handling. Consider wearing a face mask if working in moldy areas.  Indoor Mold Control Maintain humidity below 50%. Clean  washable surfaces with 5% bleach solution. Remove sources e.g. Contaminated carpets.  Control of Dog or Cat Allergen Avoidance is the best way to manage a dog or cat allergy. If you have a dog or cat and are allergic to dog or cats, consider removing the dog or cat from the home. If you have a dog or cat but don't want to find it a new home, or if your family wants a pet even though someone in the household is allergic, here are some strategies that may help keep symptoms at bay:  Keep the pet out of your bedroom and restrict it to only a few rooms. Be advised that keeping the dog or cat in only one room will not limit the allergens to that room. Don't pet, hug or kiss the dog or cat; if you do, wash your hands with soap and water. High-efficiency particulate air (HEPA) cleaners run continuously in a bedroom or living room can reduce allergen levels over time. Regular use of a high-efficiency vacuum cleaner or a central vacuum can reduce allergen levels. Giving your dog or cat a bath at least once a week can reduce airborne allergen.  Control of Cockroach Allergen Cockroach allergen has been identified as an important cause of acute attacks of asthma, especially in urban settings.  There are fifty-five species of cockroach that exist in the [place], however only three, the [place], [place] species produce allergen that can affect patients with Asthma.  Allergens can be obtained from fecal particles, egg casings and secretions from cockroaches.    Remove food sources. Reduce access to water. Seal access and entry points. Spray runways with 0.5-1% Diazinon or Chlorpyrifos Blow boric acid power under stoves and refrigerator. Place bait stations (hydramethylnon) at feeding sites.    Control of Dust Mite Allergen Dust mites play a major role in allergic asthma and rhinitis. They occur in environments with high humidity wherever human skin is found. Dust mites absorb humidity  from the atmosphere (ie, they do not drink) and feed on organic matter (including shed human and animal skin). Dust mites are a microscopic type of insect that you cannot see with the naked eye. High levels of dust mites have been detected from mattresses, pillows, carpets, upholstered furniture, bed covers, clothes, soft toys and any woven material. The principal allergen of the dust mite is found in its feces. A gram of dust may contain 1,000 mites and 250,000 fecal particles. Mite antigen is easily measured in the air during house cleaning activities. Dust mites do not bite and do not cause harm to humans, other than by triggering allergies/asthma.  Ways to decrease your exposure to dust mites in your home:  1. Encase mattresses, box springs and pillows with a mite-impermeable barrier or cover  2. Wash sheets, blankets and drapes weekly in hot water (130 F) with detergent and dry them in a dryer on the hot setting.  3. Have the room cleaned frequently with a vacuum cleaner and a damp dust-mop. For carpeting or rugs, vacuuming with a vacuum cleaner equipped with a high-efficiency particulate air (HEPA) filter. The dust mite allergic individual should  not be in a room which is being cleaned and should wait 1 hour after cleaning before going into the room.  4. Do not sleep on upholstered furniture (eg, couches).  5. If possible removing carpeting, upholstered furniture and drapery from the home is ideal. Horizontal blinds should be eliminated in the rooms where the person spends the most time (bedroom, study, television room). Washable vinyl, roller-type shades are optimal.  6. Remove all non-washable stuffed toys from the bedroom. Wash stuffed toys weekly like sheets and blankets above.  7. Reduce indoor humidity to less than 50%. Inexpensive humidity monitors can be purchased at most hardware stores. Do not use a humidifier as can make the problem worse and are not recommended.

## 2023-12-04 DIAGNOSIS — J45998 Other asthma: Secondary | ICD-10-CM | POA: Diagnosis not present

## 2023-12-09 DIAGNOSIS — D649 Anemia, unspecified: Secondary | ICD-10-CM | POA: Diagnosis not present

## 2023-12-09 DIAGNOSIS — D508 Other iron deficiency anemias: Secondary | ICD-10-CM | POA: Diagnosis not present

## 2023-12-17 ENCOUNTER — Ambulatory Visit (INDEPENDENT_AMBULATORY_CARE_PROVIDER_SITE_OTHER)

## 2023-12-17 DIAGNOSIS — J309 Allergic rhinitis, unspecified: Secondary | ICD-10-CM | POA: Diagnosis not present

## 2023-12-25 DIAGNOSIS — G35 Multiple sclerosis: Secondary | ICD-10-CM | POA: Diagnosis not present

## 2023-12-27 ENCOUNTER — Other Ambulatory Visit: Payer: Self-pay | Admitting: Family Medicine

## 2023-12-28 DIAGNOSIS — G35 Multiple sclerosis: Secondary | ICD-10-CM | POA: Diagnosis not present

## 2024-01-03 DIAGNOSIS — J45998 Other asthma: Secondary | ICD-10-CM | POA: Diagnosis not present

## 2024-01-12 ENCOUNTER — Other Ambulatory Visit: Payer: Self-pay | Admitting: Internal Medicine

## 2024-01-21 ENCOUNTER — Telehealth: Payer: Self-pay | Admitting: Internal Medicine

## 2024-01-21 ENCOUNTER — Ambulatory Visit (INDEPENDENT_AMBULATORY_CARE_PROVIDER_SITE_OTHER)

## 2024-01-21 DIAGNOSIS — J309 Allergic rhinitis, unspecified: Secondary | ICD-10-CM

## 2024-01-21 MED ORDER — PROAIR RESPICLICK 108 (90 BASE) MCG/ACT IN AEPB: INHALATION_SPRAY | RESPIRATORY_TRACT | 2 refills | Status: DC

## 2024-01-21 NOTE — Telephone Encounter (Signed)
 Sara Hayes came in to the office and stated that the pharmacy denied her refills for [name].  She would like refills sent to [facility] on [address]

## 2024-01-21 NOTE — Telephone Encounter (Signed)
 Per Provider previous notation:  Yes, this is the only rescue inhaler she can have. She can't use the other type due to arthritis. Please send refill of respiclick.   [employer] Respiclick inhaler prescription sent to [address]

## 2024-02-03 DIAGNOSIS — J45998 Other asthma: Secondary | ICD-10-CM | POA: Diagnosis not present

## 2024-02-18 ENCOUNTER — Ambulatory Visit (INDEPENDENT_AMBULATORY_CARE_PROVIDER_SITE_OTHER)

## 2024-02-18 DIAGNOSIS — J309 Allergic rhinitis, unspecified: Secondary | ICD-10-CM | POA: Diagnosis not present

## 2024-03-05 DIAGNOSIS — J45998 Other asthma: Secondary | ICD-10-CM | POA: Diagnosis not present

## 2024-03-07 ENCOUNTER — Other Ambulatory Visit: Payer: Self-pay

## 2024-03-07 ENCOUNTER — Ambulatory Visit (INDEPENDENT_AMBULATORY_CARE_PROVIDER_SITE_OTHER): Payer: 59 | Admitting: Internal Medicine

## 2024-03-07 ENCOUNTER — Encounter: Payer: Self-pay | Admitting: Internal Medicine

## 2024-03-07 VITALS — BP 112/64 | HR 65 | Temp 97.8°F | Resp 18 | Ht 66.0 in | Wt 224.3 lb

## 2024-03-07 DIAGNOSIS — J302 Other seasonal allergic rhinitis: Secondary | ICD-10-CM | POA: Diagnosis not present

## 2024-03-07 DIAGNOSIS — J3089 Other allergic rhinitis: Secondary | ICD-10-CM | POA: Diagnosis not present

## 2024-03-07 DIAGNOSIS — H1013 Acute atopic conjunctivitis, bilateral: Secondary | ICD-10-CM

## 2024-03-07 DIAGNOSIS — Z880 Allergy status to penicillin: Secondary | ICD-10-CM | POA: Diagnosis not present

## 2024-03-07 DIAGNOSIS — J4540 Moderate persistent asthma, uncomplicated: Secondary | ICD-10-CM | POA: Diagnosis not present

## 2024-03-07 MED ORDER — FLUTICASONE PROPIONATE 50 MCG/ACT NA SUSP: 1.0000 | Freq: Two times a day (BID) | NASAL | 5 refills | Status: DC

## 2024-03-07 MED ORDER — MONTELUKAST SODIUM 10 MG PO TABS: 10.0000 mg | ORAL_TABLET | Freq: Every day | ORAL | 5 refills | Status: DC

## 2024-03-07 MED ORDER — ALBUTEROL SULFATE (2.5 MG/3ML) 0.083% IN NEBU: 2.5000 mg | INHALATION_SOLUTION | RESPIRATORY_TRACT | 1 refills | Status: AC | PRN

## 2024-03-07 MED ORDER — BUDESONIDE 0.5 MG/2ML IN SUSP: RESPIRATORY_TRACT | 1 refills | Status: AC

## 2024-03-07 MED ORDER — AZELASTINE HCL 0.1 % NA SOLN: 2.0000 | Freq: Two times a day (BID) | NASAL | 12 refills | Status: AC | PRN

## 2024-03-07 MED ORDER — LEVOCETIRIZINE DIHYDROCHLORIDE 5 MG PO TABS: 5.0000 mg | ORAL_TABLET | Freq: Every evening | ORAL | 4 refills | Status: DC

## 2024-03-07 MED ORDER — TRELEGY ELLIPTA 200-62.5-25 MCG/ACT IN AEPB: 1.0000 | INHALATION_SPRAY | Freq: Every day | RESPIRATORY_TRACT | 5 refills | Status: DC

## 2024-03-07 NOTE — Patient Instructions (Addendum)
 Asthma-moderate persistent, not well controlled. Spirometry showing restriction with low FEV1and significant improvement following bronchodilator Continue Trelegy 200 mcg 1 puffs daily. Rinse mouth after use. Continue montelukast  10 mg once a day to prevent cough or wheeze During asthma flares/respiratory illness: add budesonide  via nebulizer twice a day to control asthma symptoms. Use for 1-2 weeks or until symptoms resolve. Rinse mouth out after use.  Continue albuterol  2 puffs once every 4 hours if needed for cough or wheeze You may use albuterol  2 puffs 5 to 15 minutes before activity to decrease cough or wheeze -updated labs for biologics today. Submit for [employer].  Allergic rhinitis Continue allergen avoidance measures directed toward grass pollen, molds, cat, cockroach, and dust mite as listed below Continue an antihistamine once a day as needed for runny nose or itch. Remember to rotate to a different antihistamine about every 3 months. Some examples of over the counter antihistamines include Zyrtec (cetirizine), Xyzal  (levocetirizine), Allegra (fexofenadine), and Claritin (loratidine).  Continue Flonase  1 sprays in each nostril twice a day as needed for stuffy nose Continue azelastine  2 sprays in each also up to twice a day as needed for runny nose Begin saline nasal rinses as needed for nasal symptoms. Use this before any medicated nasal sprays for best result Continue allergen immunotherapy and have access to an epinephrine  autoinjector set. Do not get an injection until your breathing is back at baseline  Allergic conjunctivitis Some over the counter eye drops include Pataday  one drop in each eye once a day as needed for red, itchy eyes OR Zaditor one drop in each eye twice a day as needed for red itchy eyes. Avoid eye drops that say red eye relief as they may contain medications that dry out your eyes.   Call the clinic if this treatment plan is not working well for you.  We  will submit paperwork for [place]. You will hear from our biologics coordinator Tammy VonCannon. Please answer her phone calls to ensure a seamless approval process.   Follow up in 3 months or sooner if needed. It was a pleasure seeing you again in clinic today! Thank you for allowing me to participate in your care.  Jonathon Neighbors, MD [facility]   Reducing Pollen Exposure The American Academy of Allergy, Asthma and Immunology suggests the following steps to reduce your exposure to pollen during allergy seasons. Do not hang sheets or clothing out to dry; pollen may collect on these items. Do not mow lawns or spend time around freshly cut grass; mowing stirs up pollen. Keep windows closed at night.  Keep car windows closed while driving. Minimize morning activities outdoors, a time when pollen counts are usually at their highest. Stay indoors as much as possible when pollen counts or humidity is high and on windy days when pollen tends to remain in the air longer. Use air conditioning when possible.  Many air conditioners have filters that trap the pollen spores. Use a HEPA room air filter to remove pollen form the indoor air you breathe.  Control of Mold Allergen Mold and fungi can grow on a variety of surfaces provided certain temperature and moisture conditions exist.  Outdoor molds grow on plants, decaying vegetation and soil.  The major outdoor mold, Alternaria and Cladosporium, are found in very high numbers during hot and dry conditions.  Generally, a late Summer - Fall peak is seen for common outdoor fungal spores.  Rain will temporarily lower outdoor mold spore count, but  counts rise rapidly when the rainy period ends.  The most important indoor molds are Aspergillus and Penicillium.  Dark, humid and poorly ventilated basements are ideal sites for mold growth.  The next most common sites of mold growth are the bathroom and the kitchen.  Outdoor [employer] Use air  conditioning and keep windows closed Avoid exposure to decaying vegetation. Avoid leaf raking. Avoid grain handling. Consider wearing a face mask if working in moldy areas.  Indoor Mold Control Maintain humidity below 50%. Clean washable surfaces with 5% bleach solution. Remove sources e.g. Contaminated carpets.  Control of Dog or Cat Allergen Avoidance is the best way to manage a dog or cat allergy. If you have a dog or cat and are allergic to dog or cats, consider removing the dog or cat from the home. If you have a dog or cat but don't want to find it a new home, or if your family wants a pet even though someone in the household is allergic, here are some strategies that may help keep symptoms at bay:  Keep the pet out of your bedroom and restrict it to only a few rooms. Be advised that keeping the dog or cat in only one room will not limit the allergens to that room. Don't pet, hug or kiss the dog or cat; if you do, wash your hands with soap and water. High-efficiency particulate air (HEPA) cleaners run continuously in a bedroom or living room can reduce allergen levels over time. Regular use of a high-efficiency vacuum cleaner or a central vacuum can reduce allergen levels. Giving your dog or cat a bath at least once a week can reduce airborne allergen.  Control of Cockroach Allergen Cockroach allergen has been identified as an important cause of acute attacks of asthma, especially in urban settings.  There are fifty-five species of cockroach that exist in the [place] , however only three, the [place], [place] and [place] species produce allergen that can affect patients with Asthma.  Allergens can be obtained from fecal particles, egg casings and secretions from cockroaches.    Remove food sources. Reduce access to water. Seal access and entry points. Spray runways with 0.5-1% Diazinon or Chlorpyrifos Blow boric acid power under stoves and refrigerator. Place bait stations  (hydramethylnon) at feeding sites.    Control of Dust Mite Allergen Dust mites play a major role in allergic asthma and rhinitis. They occur in environments with high humidity wherever human skin is found. Dust mites absorb humidity from the atmosphere (ie, they do not drink) and feed on organic matter (including shed human and animal skin). Dust mites are a microscopic type of insect that you cannot see with the naked eye. High levels of dust mites have been detected from mattresses, pillows, carpets, upholstered furniture, bed covers, clothes, soft toys and any woven material. The principal allergen of the dust mite is found in its feces. A gram of dust may contain 1,000 mites and 250,000 fecal particles. Mite antigen is easily measured in the air during house cleaning activities. Dust mites do not bite and do not cause harm to humans, other than by triggering allergies/asthma.  Ways to decrease your exposure to dust mites in your home:  1. Encase mattresses, box springs and pillows with a mite-impermeable barrier or cover  2. Wash sheets, blankets and drapes weekly in hot water (130 F) with detergent and dry them in a dryer on the hot setting.  3. Have the room cleaned frequently with a vacuum  cleaner and a damp dust-mop. For carpeting or rugs, vacuuming with a vacuum cleaner equipped with a high-efficiency particulate air (HEPA) filter. The dust mite allergic individual should not be in a room which is being cleaned and should wait 1 hour after cleaning before going into the room.  4. Do not sleep on upholstered furniture (eg, couches).  5. If possible removing carpeting, upholstered furniture and drapery from the home is ideal. Horizontal blinds should be eliminated in the rooms where the person spends the most time (bedroom, study, television room). Washable vinyl, roller-type shades are optimal.  6. Remove all non-washable stuffed toys from the bedroom. Wash stuffed toys weekly like sheets  and blankets above.  7. Reduce indoor humidity to less than 50%. Inexpensive humidity monitors can be purchased at most hardware stores. Do not use a humidifier as can make the problem worse and are not recommended.

## 2024-03-07 NOTE — Progress Notes (Signed)
 FOLLOW UP Date of Service/Encounter:  03/07/24  Subjective:  Sara Hayes (DOB: 07-01-1971) is a 53 y.o. female who [facility] in re-evaluation of the following: asthma, allergic rhinitis, and allergic conjunctivitis  History obtained from: chart review and patient.  For Review, LV was on 11/30/23  with Dr.Jovie Swanner seen for routine follow-up. See below for summary of history and diagnostics.   Therapeutic plans/changes recommended: spirometry showed FEV1 70% with obstruction. Trelegy was started.  ----------------------------------------------------- Pertinent History/Diagnostics:  Asthma:  Moderate persistent.  No history of hospitalizations, no steroids in the past year.  Uses rescue inhaler around once a week. Has a difficult time using HFA inhalers due to arthritis. - spirometry (01/08/2022): ratio 102%, 87% FEV1 -Prior PFTs or spirometry: 2015-normal pulmonary function test with ratio of 90% and FEV1 of 101%- - Most Recent AEC (10/14/21): 200 Current Meds: Trelegy 200 1 puff daily (11/30/23), when sick: budesonide  0.5 mg BID (switched from Arnuity 200 09/2023), ProAir  Respiclick as needed Allergic Rhinitis:  Moved from [place] to [place] in 2012.  Uncontrolled with daily antihistamine, Flonase , and Singulair  daily.Uses restasis for dry eyes.  Follows with ophthalmologist.  Last appointment was 2022 in the Spring.  Started AIT 02/11/2022.  Receiving Vial 1 (grass, dust mite, cat) vial 2 (cockroach and molds). Reached maintenance dose of 0.5 mL red vial on 08/20/22. - SPT environmental panel (01/08/2022): positive to cats, intradermals positive to [place] grass, mold mix 1, 2, 3 and 4, cockroach, mite mix Current meds: Flonase , azelastine  nasal spray, Singulair , oral OTC AH, OTC allergy eyedrops Hymenoptera allergy   - Hx of reaction: Developed a local reaction but then became lightheaded, no syncope, but did require emergency room  treatment. -IgE negative to honeybee, white hornet, yellow jacket, paper wasp, yellow hornet, bumblebee, tryptase 3.5 (normal baseline).   - skin testing 04/22/22: negative to honeybee, yellow jacket, yellow hornet, white hornet, and wasp Current Meds: EpiPen  as needed. 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  Testing followed by challenge if indicated offered. Other: has Multiple sclerosis and seronegative RA, currently not on any therapy. --------------------------------------------------- Today presents for follow-up. Discussed the use of AI scribe software for clinical note transcription with the patient, who gave verbal consent to proceed.  History of Present Illness   Sara Hayes is a 53 year old female with asthma who presents with worsening asthma symptoms.  Over the past few weeks, she has experienced worsening asthma symptoms, particularly exacerbated by environmental factors such as rain and smoke. She has stayed indoors for four days due to rain and experiences difficulty when exposed to smoke from vehicles and marijuana. She uses her RespiClick multiple times a day, especially when exposed to triggers, and reports that it helps alleviate her symptoms. She also uses Flonase  and azelastine  regularly.  She describes episodes of chest tightness and wheezing, which sometimes wake her at night. She has not required steroids or antibiotics recently. She uses budesonide  via nebulizer, last used two days ago, and takes one puff of her preventive inhaler daily as directed (trelegy 200).  She has a history of syncope, with the most recent episode occurring about three weeks ago in a store. She attributes these episodes to overheating and has experienced them in the past, including one where she hit her head. She has had medical evaluations, including blood pressure checks and blood tests, after her episodes of syncope. She is cautious  about overheating and avoids situations that may trigger these episodes.  She has multiple sclerosis, Sjogren's syndrome, and arthritis but is not currently taking specific medications for these conditions, except for gabapentin . She has not experienced uncontrollable movements in her arms or face recently and has not used clonazepam.  Her social history includes living with her children, who monitor her closely due to her health issues. She has adjusted her lifestyle to avoid triggers, such as changing her detergent to a hypoallergenic brand and avoiding scented products which is preventing rash and itching.       Chart Review: Last AIT injection 05/215/25, received 0.5 mL of both red vials. AEC 05/2023: 200  All medications reviewed by clinical staff and updated in chart. No new pertinent medical or surgical history except as noted in HPI.  ROS: All others negative except as noted per HPI.   Objective:  BP 112/64 (BP Location: Right Arm, Patient Position: Sitting, Cuff Size: Normal)   Pulse 65   Temp 97.8 F (36.6 C) (Temporal)   Resp 18   Ht 5\' 6"  (1.676 m)   Wt 224 lb 4.8 oz (101.7 kg)   SpO2 97%   BMI 36.20 kg/m  Body mass index is 36.2 kg/m. Physical Exam: General Appearance:  Alert, cooperative, no distress, appears stated age  Head:  Normocephalic, without obvious abnormality, atraumatic  Eyes:  Conjunctiva clear, EOM's intact  Ears EACs normal bilaterally and normal TMs bilaterally  Nose: Nares normal, normal mucosa and no visible anterior polyps  Throat: Lips, tongue normal; teeth and gums normal, normal posterior oropharynx  Neck: Supple, symmetrical  Lungs:   clear to auscultation bilaterally, Respirations unlabored, no coughing  Heart:  regular rate and rhythm and no murmur, Appears well perfused  Extremities: No edema  Skin: Skin color, texture, turgor normal and no rashes or lesions on visualized portions of skin  Neurologic: No gross deficits   Labs:  Lab  Orders         CBC with Differential/Platelet         IgE      Spirometry:  Tracings reviewed. Her effort: It was hard to get consistent efforts and there is a question as to whether this reflects a maximal maneuver. FVC: 2.07L, 2.44 L post FEV1: 1.49L, 61% predicted pre, 1.89L, 78% predicted post FEV1/FVC ratio: 0.72 pre, 0.77 post Interpretation: Nonobstructive ratio, low FEV1, possible restriction. With significant response to bronchodilator (4 puffs Xopenex) Please see scanned spirometry results for details. Results worse than last visit.  Assessment/Plan   Asthma-moderate persistent, not well controlled. Spirometry showing restriction with low FEV1and significant improvement following bronchodilator Continue Trelegy 200 mcg 1 puffs daily. Rinse mouth after use. Continue montelukast  10 mg once a day to prevent cough or wheeze During asthma flares/respiratory illness: add budesonide  via nebulizer twice a day to control asthma symptoms. Use for 1-2 weeks or until symptoms resolve. Rinse mouth out after use.  Continue albuterol  2 puffs once every 4 hours if needed for cough or wheeze You may use albuterol  2 puffs 5 to 15 minutes before activity to decrease cough or wheeze -updated labs for biologics today. Submit for [employer].  Allergic rhinitis-improved on AIT Continue allergen avoidance measures directed toward grass pollen, molds, cat, cockroach, and dust mite as listed below Continue an antihistamine once a day as needed for runny nose or itch. Remember to rotate to a different antihistamine about every 3 months. Some examples of over the counter antihistamines include Zyrtec (cetirizine), Xyzal  (levocetirizine),  Allegra (fexofenadine), and Claritin (loratidine).  Continue Flonase  1 sprays in each nostril twice a day as needed for stuffy nose Continue azelastine  2 sprays in each also up to twice a day as needed for runny nose Begin saline nasal rinses as needed for nasal symptoms.  Use this before any medicated nasal sprays for best result Continue allergen immunotherapy and have access to an epinephrine  autoinjector set. Do not get an injection until your breathing is back at baseline  Allergic conjunctivitis-improved on AIT Some over the counter eye drops include Pataday  one drop in each eye once a day as needed for red, itchy eyes OR Zaditor one drop in each eye twice a day as needed for red itchy eyes. Avoid eye drops that say red eye relief as they may contain medications that dry out your eyes.   Call the clinic if this treatment plan is not working well for you.  We will submit paperwork for Tezspire. You will hear from our biologics coordinator Tammy VonCannon. Please answer her phone calls to ensure a seamless approval process.   Follow up in 3 months or sooner if needed. It was a pleasure seeing you again in clinic today! Thank you for allowing me to participate in your care.  Other: none  Jonathon Neighbors, MD  [facility] 

## 2024-03-09 ENCOUNTER — Ambulatory Visit: Payer: Self-pay | Admitting: Internal Medicine

## 2024-03-09 LAB — CBC WITH DIFFERENTIAL/PLATELET
Basophils Absolute: 0 10*3/uL (ref 0.0–0.2)
Basos: 0 %
EOS (ABSOLUTE): 0.2 10*3/uL (ref 0.0–0.4)
Eos: 4 %
Hematocrit: 39.6 % (ref 34.0–46.6)
Hemoglobin: 12.5 g/dL (ref 11.1–15.9)
Immature Grans (Abs): 0 10*3/uL (ref 0.0–0.1)
Immature Granulocytes: 0 %
Lymphocytes Absolute: 1.3 10*3/uL (ref 0.7–3.1)
Lymphs: 27 %
MCH: 28.9 pg (ref 26.6–33.0)
MCHC: 31.6 g/dL (ref 31.5–35.7)
MCV: 92 fL (ref 79–97)
Monocytes Absolute: 0.5 10*3/uL (ref 0.1–0.9)
Monocytes: 10 %
Neutrophils Absolute: 2.8 10*3/uL (ref 1.4–7.0)
Neutrophils: 59 %
Platelets: 231 10*3/uL (ref 150–450)
RBC: 4.33 x10E6/uL (ref 3.77–5.28)
RDW: 13.8 % (ref 11.7–15.4)
WBC: 4.8 10*3/uL (ref 3.4–10.8)

## 2024-03-09 LAB — IGE: IgE (Immunoglobulin E), Serum: 161 [IU]/mL (ref 6–495)

## 2024-03-09 NOTE — Progress Notes (Signed)
 Please let Sara Hayes know her blood work returned, would still choose Tezspire for her as an injectable asthma medication. Her blood counts are normal and she is making a normal amount of allergic antibody. We will wait to see if she is approved for Tezspire.

## 2024-03-16 ENCOUNTER — Telehealth: Payer: Self-pay | Admitting: *Deleted

## 2024-03-16 MED ORDER — TEZSPIRE 210 MG/1.91ML ~~LOC~~ SOAJ
210.0000 mg | SUBCUTANEOUS | 11 refills | Status: AC
  Filled 2024-03-17: qty 1.91, 28d supply, fill #0
  Filled 2024-04-04 – 2024-04-15 (×2): qty 1.91, 28d supply, fill #1
  Filled 2024-05-11: qty 1.91, 28d supply, fill #2
  Filled 2024-06-02 – 2024-06-21 (×2): qty 1.91, 28d supply, fill #3
  Filled 2024-07-14: qty 1.91, 28d supply, fill #4
  Filled 2024-08-09: qty 1.91, 28d supply, fill #5
  Filled 2024-09-07: qty 1.91, 28d supply, fill #6
  Filled 2024-10-10: qty 1.91, 28d supply, fill #7
  Filled 2024-10-28 – 2024-11-07 (×2): qty 1.91, 28d supply, fill #8

## 2024-03-16 NOTE — Telephone Encounter (Signed)
-----   Message from Sean Czar sent at 03/07/2024  1:18 PM EDT ----- Can we submit for Tezspiire please?

## 2024-03-16 NOTE — Telephone Encounter (Signed)
 Called patient and advised approval and submit for tezspire to [place]. Will reach out to schedule appt to start once delivery set. She wants inclinic admin

## 2024-03-17 ENCOUNTER — Other Ambulatory Visit (HOSPITAL_COMMUNITY): Payer: Self-pay

## 2024-03-17 ENCOUNTER — Other Ambulatory Visit: Payer: Self-pay

## 2024-03-17 NOTE — Progress Notes (Signed)
 Specialty Pharmacy Initial Fill Coordination Note  ABBAGALE GOGUEN is a 53 y.o. female contacted today regarding initial fill of specialty medication(s) Tezepelumab-ekko Rosalind [place])   Patient requested Courier to Provider Office   Delivery date: 03/22/24   Verified address: [address] [place], [place] [address]   Medication will be filled on 06/16.   Patient is aware of $0.00 copayment.

## 2024-03-17 NOTE — Progress Notes (Signed)
 Specialty Pharmacy Initiation Note   Sara Hayes is a 53 y.o. female who will be followed by the specialty pharmacy service for RxSp Asthma/COPD    Review of administration, indication, effectiveness, safety, potential side effects, storage/disposable, and missed dose instructions occurred today for patient's specialty medication(s) Tezepelumab-ekko Rosalind [place])     Patient/Caregiver asked additional questions regarding initiating this medication with comorbid conditions such as MS and RA. Advised that it is best to reach out to her neurologist and rheumatologist before starting treatment to ensure that they do not feel there are any negative implications on her other conditions. Patient will contact them before starting treatment.  Patient's therapy is appropriate to: Initiate    Goals Addressed             This Visit's Progress    Reduce disease symptoms including coughing and shortness of breath       Patient is initiating therapy. Patient will maintain adherence         Rena Carnes Specialty Pharmacist

## 2024-03-21 ENCOUNTER — Ambulatory Visit (INDEPENDENT_AMBULATORY_CARE_PROVIDER_SITE_OTHER)

## 2024-03-21 DIAGNOSIS — J309 Allergic rhinitis, unspecified: Secondary | ICD-10-CM

## 2024-03-21 DIAGNOSIS — Z79899 Other long term (current) drug therapy: Secondary | ICD-10-CM | POA: Diagnosis not present

## 2024-03-21 DIAGNOSIS — G35 Multiple sclerosis: Secondary | ICD-10-CM | POA: Diagnosis not present

## 2024-03-21 DIAGNOSIS — M069 Rheumatoid arthritis, unspecified: Secondary | ICD-10-CM | POA: Diagnosis not present

## 2024-03-21 DIAGNOSIS — H25813 Combined forms of age-related cataract, bilateral: Secondary | ICD-10-CM | POA: Diagnosis not present

## 2024-03-21 DIAGNOSIS — H35032 Hypertensive retinopathy, left eye: Secondary | ICD-10-CM | POA: Diagnosis not present

## 2024-03-25 DIAGNOSIS — D84821 Immunodeficiency due to drugs: Secondary | ICD-10-CM | POA: Diagnosis not present

## 2024-03-25 DIAGNOSIS — G894 Chronic pain syndrome: Secondary | ICD-10-CM | POA: Diagnosis not present

## 2024-03-25 DIAGNOSIS — J4520 Mild intermittent asthma, uncomplicated: Secondary | ICD-10-CM | POA: Diagnosis not present

## 2024-03-25 DIAGNOSIS — Z136 Encounter for screening for cardiovascular disorders: Secondary | ICD-10-CM | POA: Diagnosis not present

## 2024-03-25 DIAGNOSIS — D6859 Other primary thrombophilia: Secondary | ICD-10-CM | POA: Diagnosis not present

## 2024-03-25 DIAGNOSIS — Z0000 Encounter for general adult medical examination without abnormal findings: Secondary | ICD-10-CM | POA: Diagnosis not present

## 2024-03-25 DIAGNOSIS — E039 Hypothyroidism, unspecified: Secondary | ICD-10-CM | POA: Diagnosis not present

## 2024-03-25 DIAGNOSIS — Z23 Encounter for immunization: Secondary | ICD-10-CM | POA: Diagnosis not present

## 2024-03-25 DIAGNOSIS — G379 Demyelinating disease of central nervous system, unspecified: Secondary | ICD-10-CM | POA: Diagnosis not present

## 2024-03-25 DIAGNOSIS — Z96641 Presence of right artificial hip joint: Secondary | ICD-10-CM | POA: Diagnosis not present

## 2024-03-25 DIAGNOSIS — M069 Rheumatoid arthritis, unspecified: Secondary | ICD-10-CM | POA: Diagnosis not present

## 2024-03-28 DIAGNOSIS — Z96642 Presence of left artificial hip joint: Secondary | ICD-10-CM | POA: Diagnosis not present

## 2024-03-28 DIAGNOSIS — Z471 Aftercare following joint replacement surgery: Secondary | ICD-10-CM | POA: Diagnosis not present

## 2024-03-29 ENCOUNTER — Ambulatory Visit (INDEPENDENT_AMBULATORY_CARE_PROVIDER_SITE_OTHER)

## 2024-03-29 DIAGNOSIS — J4550 Severe persistent asthma, uncomplicated: Secondary | ICD-10-CM | POA: Diagnosis not present

## 2024-03-29 MED ORDER — TEZEPELUMAB-EKKO 210 MG/1.91ML ~~LOC~~ SOSY
210.0000 mg | PREFILLED_SYRINGE | SUBCUTANEOUS | Status: AC
  Administered 2024-03-29 – 2024-10-19 (×8): 210 mg via SUBCUTANEOUS

## 2024-03-29 NOTE — Progress Notes (Signed)
 Immunotherapy   Patient Details  Name: Sara Hayes MRN: [id number] Date of Birth: 21-Dec-1970  03/29/2024  Sara Hayes started injections for Asthma. Patient received 210 mg Tezspire  and waited 15 minutes with no problems.  Frequency:every 28 days Epi-Pen:Epi-Pen Available  Consent signed and patient instructions given.   Sara Hayes 03/29/2024, 12:14 PM

## 2024-04-04 ENCOUNTER — Other Ambulatory Visit: Payer: Self-pay

## 2024-04-04 DIAGNOSIS — J302 Other seasonal allergic rhinitis: Secondary | ICD-10-CM | POA: Diagnosis not present

## 2024-04-04 DIAGNOSIS — J45998 Other asthma: Secondary | ICD-10-CM | POA: Diagnosis not present

## 2024-04-04 NOTE — Progress Notes (Signed)
 VIALS MADE 04-04-24

## 2024-04-05 DIAGNOSIS — J3089 Other allergic rhinitis: Secondary | ICD-10-CM | POA: Diagnosis not present

## 2024-04-07 ENCOUNTER — Other Ambulatory Visit: Payer: Self-pay

## 2024-04-15 ENCOUNTER — Other Ambulatory Visit: Payer: Self-pay | Admitting: *Deleted

## 2024-04-15 ENCOUNTER — Other Ambulatory Visit: Payer: Self-pay

## 2024-04-15 MED ORDER — PROAIR RESPICLICK 108 (90 BASE) MCG/ACT IN AEPB
INHALATION_SPRAY | RESPIRATORY_TRACT | 2 refills | Status: DC
Start: 2024-04-15 — End: 2024-06-13

## 2024-04-15 NOTE — Progress Notes (Signed)
 Specialty Pharmacy Refill Coordination Note  Sara Hayes is a 53 y.o. female contacted today regarding refills of specialty medication(s) Tezepelumab -ekko (TEZSPIRE )   Patient requested Courier to Provider Office   Delivery date: 04/21/24   Verified address: [address] [place], [place] [address]   Medication will be filled on 04/20/24.

## 2024-04-19 ENCOUNTER — Ambulatory Visit (INDEPENDENT_AMBULATORY_CARE_PROVIDER_SITE_OTHER)

## 2024-04-19 DIAGNOSIS — J309 Allergic rhinitis, unspecified: Secondary | ICD-10-CM

## 2024-04-20 ENCOUNTER — Other Ambulatory Visit (HOSPITAL_COMMUNITY): Payer: Self-pay

## 2024-04-26 ENCOUNTER — Ambulatory Visit

## 2024-04-26 DIAGNOSIS — J4550 Severe persistent asthma, uncomplicated: Secondary | ICD-10-CM

## 2024-05-05 DIAGNOSIS — J45998 Other asthma: Secondary | ICD-10-CM | POA: Diagnosis not present

## 2024-05-11 ENCOUNTER — Other Ambulatory Visit: Payer: Self-pay

## 2024-05-11 NOTE — Progress Notes (Signed)
 Specialty Pharmacy Refill Coordination Note  Sara Hayes is a 53 y.o. female assessed today regarding refills of clinic administered specialty medication(s) Tezepelumab -ekko (TEZSPIRE )   Clinic requested Courier to Provider Office   Delivery date: 05/16/24  Injection date: 05/24/24   Verified address: [address] [place], [place] [address]   Medication will be filled on 05/13/24.

## 2024-05-12 ENCOUNTER — Other Ambulatory Visit: Payer: Self-pay

## 2024-05-23 ENCOUNTER — Ambulatory Visit (INDEPENDENT_AMBULATORY_CARE_PROVIDER_SITE_OTHER)

## 2024-05-23 DIAGNOSIS — J309 Allergic rhinitis, unspecified: Secondary | ICD-10-CM

## 2024-05-24 ENCOUNTER — Ambulatory Visit

## 2024-05-24 DIAGNOSIS — M1610 Unilateral primary osteoarthritis, unspecified hip: Secondary | ICD-10-CM | POA: Diagnosis not present

## 2024-05-24 DIAGNOSIS — M0880 Other juvenile arthritis, unspecified site: Secondary | ICD-10-CM | POA: Diagnosis not present

## 2024-05-24 DIAGNOSIS — M5416 Radiculopathy, lumbar region: Secondary | ICD-10-CM | POA: Diagnosis not present

## 2024-05-24 DIAGNOSIS — M3500 Sicca syndrome, unspecified: Secondary | ICD-10-CM | POA: Diagnosis not present

## 2024-05-25 ENCOUNTER — Other Ambulatory Visit: Payer: Self-pay

## 2024-05-25 MED ORDER — MONTELUKAST SODIUM 10 MG PO TABS: 10.0000 mg | ORAL_TABLET | Freq: Every day | ORAL | 1 refills | Status: DC

## 2024-05-26 DIAGNOSIS — G35 Multiple sclerosis: Secondary | ICD-10-CM | POA: Diagnosis not present

## 2024-06-01 ENCOUNTER — Ambulatory Visit (INDEPENDENT_AMBULATORY_CARE_PROVIDER_SITE_OTHER)

## 2024-06-01 DIAGNOSIS — J4550 Severe persistent asthma, uncomplicated: Secondary | ICD-10-CM | POA: Diagnosis not present

## 2024-06-01 DIAGNOSIS — J309 Allergic rhinitis, unspecified: Secondary | ICD-10-CM

## 2024-06-02 ENCOUNTER — Other Ambulatory Visit: Payer: Self-pay

## 2024-06-05 DIAGNOSIS — J45998 Other asthma: Secondary | ICD-10-CM | POA: Diagnosis not present

## 2024-06-13 ENCOUNTER — Other Ambulatory Visit: Payer: Self-pay

## 2024-06-13 ENCOUNTER — Ambulatory Visit (INDEPENDENT_AMBULATORY_CARE_PROVIDER_SITE_OTHER): Admitting: Internal Medicine

## 2024-06-13 ENCOUNTER — Encounter: Payer: Self-pay | Admitting: Internal Medicine

## 2024-06-13 VITALS — BP 122/84 | HR 76 | Temp 97.6°F | Ht 64.75 in | Wt 230.7 lb

## 2024-06-13 DIAGNOSIS — J302 Other seasonal allergic rhinitis: Secondary | ICD-10-CM | POA: Diagnosis not present

## 2024-06-13 DIAGNOSIS — G35 Multiple sclerosis: Secondary | ICD-10-CM | POA: Diagnosis not present

## 2024-06-13 DIAGNOSIS — M069 Rheumatoid arthritis, unspecified: Secondary | ICD-10-CM | POA: Insufficient documentation

## 2024-06-13 DIAGNOSIS — H1013 Acute atopic conjunctivitis, bilateral: Secondary | ICD-10-CM | POA: Diagnosis not present

## 2024-06-13 DIAGNOSIS — J4550 Severe persistent asthma, uncomplicated: Secondary | ICD-10-CM | POA: Diagnosis not present

## 2024-06-13 DIAGNOSIS — J3089 Other allergic rhinitis: Secondary | ICD-10-CM | POA: Diagnosis not present

## 2024-06-13 MED ORDER — FLUTICASONE PROPIONATE 50 MCG/ACT NA SUSP: 1.0000 | Freq: Two times a day (BID) | NASAL | 5 refills | Status: AC

## 2024-06-13 MED ORDER — LEVOCETIRIZINE DIHYDROCHLORIDE 5 MG PO TABS: 5.0000 mg | ORAL_TABLET | Freq: Every evening | ORAL | 5 refills | Status: AC

## 2024-06-13 MED ORDER — PROAIR RESPICLICK 108 (90 BASE) MCG/ACT IN AEPB: INHALATION_SPRAY | RESPIRATORY_TRACT | 2 refills | Status: DC

## 2024-06-13 MED ORDER — TRELEGY ELLIPTA 200-62.5-25 MCG/ACT IN AEPB: 1.0000 | INHALATION_SPRAY | Freq: Every day | RESPIRATORY_TRACT | 5 refills | Status: DC

## 2024-06-13 MED ORDER — MONTELUKAST SODIUM 10 MG PO TABS: 10.0000 mg | ORAL_TABLET | Freq: Every day | ORAL | 5 refills | Status: AC

## 2024-06-13 MED ORDER — OLOPATADINE HCL 0.2 % OP SOLN: 1.0000 [drp] | Freq: Every day | OPHTHALMIC | 5 refills | Status: AC | PRN

## 2024-06-13 MED ORDER — EPINEPHRINE 0.3 MG/0.3ML IJ SOAJ: 0.3000 mg | INTRAMUSCULAR | 1 refills | Status: AC | PRN

## 2024-06-13 NOTE — Progress Notes (Signed)
 FOLLOW UP Date of Service/Encounter:   06/13/2024  Subjective:  Sara Hayes (DOB: 07-09-1971) is a 53 y.o. female who [facility]24 in re-evaluation of the following: asthma, allergic rhinitis, and allergic conjunctivitis  History obtained from: chart review and patient.  For Review, LV was on 03/07/24  with Dr.Aashka Salomone seen for routine follow-up. See below for summary of history and diagnostics.   Therapeutic plans/changes recommended: asthma not controlled, using respiclick multiple times per day.FEV1 61%, with 17% improvement following bronchodilator), we submitted to start her on Tezspire . She was continued on AIT.  ----------------------------------------------------- Pertinent History/Diagnostics:  Asthma:  Moderate persistent.  No history of hospitalizations, no steroids in the past year.  Uses rescue inhaler around once a week. Has a difficult time using HFA inhalers due to arthritis. - spirometry (01/08/2022): ratio 102%, 87% FEV1 -Prior PFTs or spirometry: 2015-normal pulmonary function test with ratio of 90% and FEV1 of 101%- - Most Recent AEC (10/14/21): 200 Current Meds: Trelegy 200 1 puff daily (11/30/23), when sick: budesonide  0.5 mg BID (switched from Arnuity 200 09/2023), ProAir  Respiclick as needed - Tezspire  started 03/29/24 Allergic Rhinitis:  Moved from [place] to [place] in 2012.  Uncontrolled with daily antihistamine, Flonase , and Singulair  daily.Uses restasis for dry eyes.  Follows with ophthalmologist.  Last appointment was 2022 in the Spring.  Started AIT 02/11/2022.  Receiving Vial 1 (grass, dust mite, cat) vial 2 (cockroach and molds). Reached maintenance dose of 0.5 mL red vial on 08/20/22. - SPT environmental panel (01/08/2022): positive to cats, intradermals positive to [place] grass, mold mix 1, 2, 3 and 4, cockroach, mite mix Current meds: Flonase , azelastine  nasal spray, Singulair , oral OTC AH, OTC allergy  eyedrops Hymenoptera allergy   - Hx of reaction: Developed a local reaction but then became lightheaded, no syncope, but did require emergency room treatment. -IgE negative to honeybee, white hornet, yellow jacket, paper wasp, yellow hornet, bumblebee, tryptase 3.5 (normal baseline).   - skin testing 04/22/22: negative to honeybee, yellow jacket, yellow hornet, white hornet, and wasp Current Meds: EpiPen  as needed. 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  Testing followed by challenge if indicated offered. Other: has Multiple sclerosis and seronegative RA, currently not on any therapy. --------------------------------------------------- Today presents for follow-up. Discussed the use of AI scribe software for clinical note transcription with the patient, who gave verbal consent to proceed.  History of Present Illness Sara Hayes is a 53 year old female with asthma who presents with increased use of her rescue inhaler due to mold exposure in her home.  Asthma exacerbation and environmental triggers - Increased use of rescue inhaler, particularly when in the bedroom adjacent to attic with identified mold - Symptoms improve when staying in the front part of the house, away from the mold source - Monthly asthma injections ongoing, but continued reliance on rescue inhaler - Rescue inhalers kept in both bedroom and living room for accessibility - Thirteen-year residence in current home with ongoing mold exposure in attic - No discussion with landlord regarding mold due to concerns about remediation costs (estimated $17,000-$33,000 per mold company) - Stress related to living situation and mold exposure, perceived impact on asthma control - Attempts to manage environment with HEPA Filters  Recent gastrointestinal illness - Recent norovirus infection following travel to [place]  with children - Gastrointestinal symptoms affected her  and her family for approximately four days - Full recovery from  gastrointestinal symptoms  Medication use and disease remission - Current medications include Gabapentin  and vitamins - No other medications for chronic conditions due to improvement per neurologist and rheumatologist - Rheumatologist has stated she is in remission -she does continue on trelegy daily and does feel improvement in asthma since starting Tezspire    All medications reviewed by clinical staff and updated in chart. No new pertinent medical or surgical history except as noted in HPI.  ROS: All others negative except as noted per HPI.   Objective:  BP 122/84 (BP Location: Left Arm, Patient Position: Sitting, Cuff Size: Normal)   Pulse 76   Temp 97.6 F (36.4 C) (Temporal)   Ht 5' 4.75 (1.645 m)   Wt 230 lb 11.2 oz (104.6 kg)   SpO2 97%   BMI 38.69 kg/m  Body mass index is 38.69 kg/m. Physical Exam: General Appearance:  Alert, cooperative, no distress, appears stated age  Head:  Normocephalic, without obvious abnormality, atraumatic  Eyes:  Conjunctiva clear, EOM's intact  Ears EACs normal bilaterally and normal TMs bilaterally  Nose: Nares normal, hypertrophic turbinates, normal mucosa, and no visible anterior polyps  Throat: Lips, tongue normal; teeth and gums normal, normal posterior oropharynx  Neck: Supple, symmetrical  Lungs:   clear to auscultation bilaterally, Respirations unlabored, no coughing  Heart:  regular rate and rhythm and no murmur, Appears well perfused  Extremities: No edema  Skin: Skin color, texture, turgor normal and no rashes or lesions on visualized portions of skin  Neurologic: No gross deficits   Labs:  Lab Orders  No laboratory test(s) ordered today    Spirometry:  Tracings reviewed. Her effort: Good reproducible efforts. FVC: 2.55L FEV1: 2.12L, 87% predicted FEV1/FVC ratio: 0.83 Interpretation: Spirometry consistent with normal pattern.  Please see scanned  spirometry results for details.  Assessment/Plan   Asthma-moderate persistent, imrproved since starting Tezspire , still symptomatic suspected due to environmental exposures in home.  Spirometry today looks significantly improved from last time since starting Tezspire . Continue Trelegy 200 mcg 1 puffs daily. Rinse mouth after use. Continue montelukast  10 mg once a day to prevent cough or wheeze During asthma flares/respiratory illness: add budesonide  via nebulizer twice a day to control asthma symptoms. Use for 1-2 weeks or until symptoms resolve. Rinse mouth out after use.  Continue albuterol  2 puffs once every 4 hours if needed for cough or wheeze You may use albuterol  2 puffs 5 to 15 minutes before activity to decrease cough or wheeze Must have Proair  respiclick due to arthritis. -continue Tezspire  every 4 weeks - consider HEPA filter in homes, discuss mold remediation with landlord  Allergic rhinitis-at goal on AIT Continue allergen avoidance measures directed toward grass pollen, molds, cat, cockroach, and dust mite as listed below Continue an antihistamine once a day as needed for runny nose or itch. Remember to rotate to a different antihistamine about every 3 months. Some examples of over the counter antihistamines include Zyrtec (cetirizine), Xyzal  (levocetirizine), Allegra (fexofenadine), and Claritin (loratidine).  Continue Flonase  1 sprays in each nostril twice a day as needed for stuffy nose Continue azelastine  2 sprays in each also up to twice a day as needed for runny nose Continue saline nasal rinses as needed for nasal symptoms. Use this before any medicated nasal sprays for best result Continue allergen immunotherapy and have access to an epinephrine  autoinjector set.   Allergic conjunctivitis-at goal on AIT Some over the counter eye drops include Pataday  one drop in each eye once a day as needed for  red, itchy eyes OR Zaditor one drop in each eye twice a day as needed for  red itchy eyes. Avoid eye drops that say red eye relief as they may contain medications that dry out your eyes.   Call the clinic if this treatment plan is not working well for you.  Follow up in 6 months or sooner if needed. It was a pleasure seeing you again in clinic today! Thank you for allowing me to participate in your care.  Rocky Endow, MD [facility]   Other: none  Rocky Endow, MD  [facility] 

## 2024-06-13 NOTE — Patient Instructions (Addendum)
 Asthma-moderate persistent, Spirometry today looks significantly improved from last time since starting Tezspire . Continue Trelegy 200 mcg 1 puffs daily. Rinse mouth after use. Continue montelukast  10 mg once a day to prevent cough or wheeze During asthma flares/respiratory illness: add budesonide  via nebulizer twice a day to control asthma symptoms. Use for 1-2 weeks or until symptoms resolve. Rinse mouth out after use.  Continue albuterol  2 puffs once every 4 hours if needed for cough or wheeze You may use albuterol  2 puffs 5 to 15 minutes before activity to decrease cough or wheeze Must have Proair  respiclick due to arthritis. -continue Tezspire  every 4 weeks - consider HEPA filter in homes, discuss mold remediation with landlord  Allergic rhinitis Continue allergen avoidance measures directed toward grass pollen, molds, cat, cockroach, and dust mite as listed below Continue an antihistamine once a day as needed for runny nose or itch. Remember to rotate to a different antihistamine about every 3 months. Some examples of over the counter antihistamines include Zyrtec (cetirizine), Xyzal  (levocetirizine), Allegra (fexofenadine), and Claritin (loratidine).  Continue Flonase  1 sprays in each nostril twice a day as needed for stuffy nose Continue azelastine  2 sprays in each also up to twice a day as needed for runny nose Continue saline nasal rinses as needed for nasal symptoms. Use this before any medicated nasal sprays for best result Continue allergen immunotherapy and have access to an epinephrine  autoinjector set.   Allergic conjunctivitis Some over the counter eye drops include Pataday  one drop in each eye once a day as needed for red, itchy eyes OR Zaditor one drop in each eye twice a day as needed for red itchy eyes. Avoid eye drops that say red eye relief as they may contain medications that dry out your eyes.   Call the clinic if this treatment plan is not working well for  you.  Follow up in 6 months or sooner if needed. It was a pleasure seeing you again in clinic today! Thank you for allowing me to participate in your care.  Rocky Endow, MD [facility]   Reducing Pollen Exposure The American Academy of Allergy, Asthma and Immunology suggests the following steps to reduce your exposure to pollen during allergy seasons. Do not hang sheets or clothing out to dry; pollen may collect on these items. Do not mow lawns or spend time around freshly cut grass; mowing stirs up pollen. Keep windows closed at night.  Keep car windows closed while driving. Minimize morning activities outdoors, a time when pollen counts are usually at their highest. Stay indoors as much as possible when pollen counts or humidity is high and on windy days when pollen tends to remain in the air longer. Use air conditioning when possible.  Many air conditioners have filters that trap the pollen spores. Use a HEPA room air filter to remove pollen form the indoor air you breathe.  Control of Mold Allergen Mold and fungi can grow on a variety of surfaces provided certain temperature and moisture conditions exist.  Outdoor molds grow on plants, decaying vegetation and soil.  The major outdoor mold, Alternaria and Cladosporium, are found in very high numbers during hot and dry conditions.  Generally, a late Summer - Fall peak is seen for common outdoor fungal spores.  Rain will temporarily lower outdoor mold spore count, but counts rise rapidly when the rainy period ends.  The most important indoor molds are Aspergillus and Penicillium.  Dark, humid and poorly ventilated basements are  ideal sites for mold growth.  The next most common sites of mold growth are the bathroom and the kitchen.  Outdoor [employer] Use air conditioning and keep windows closed Avoid exposure to decaying vegetation. Avoid leaf raking. Avoid grain handling. Consider wearing a face mask if working in  moldy areas.  Indoor Mold Control Maintain humidity below 50%. Clean washable surfaces with 5% bleach solution. Remove sources e.g. Contaminated carpets.  Control of Dog or Cat Allergen Avoidance is the best way to manage a dog or cat allergy. If you have a dog or cat and are allergic to dog or cats, consider removing the dog or cat from the home. If you have a dog or cat but don't want to find it a new home, or if your family wants a pet even though someone in the household is allergic, here are some strategies that may help keep symptoms at bay:  Keep the pet out of your bedroom and restrict it to only a few rooms. Be advised that keeping the dog or cat in only one room will not limit the allergens to that room. Don't pet, hug or kiss the dog or cat; if you do, wash your hands with soap and water. High-efficiency particulate air (HEPA) cleaners run continuously in a bedroom or living room can reduce allergen levels over time. Regular use of a high-efficiency vacuum cleaner or a central vacuum can reduce allergen levels. Giving your dog or cat a bath at least once a week can reduce airborne allergen.  Control of Cockroach Allergen Cockroach allergen has been identified as an important cause of acute attacks of asthma, especially in urban settings.  There are fifty-five species of cockroach that exist in the [place] , however only three, the [place], [place] and [place] species produce allergen that can affect patients with Asthma.  Allergens can be obtained from fecal particles, egg casings and secretions from cockroaches.    Remove food sources. Reduce access to water. Seal access and entry points. Spray runways with 0.5-1% Diazinon or Chlorpyrifos Blow boric acid power under stoves and refrigerator. Place bait stations (hydramethylnon) at feeding sites.    Control of Dust Mite Allergen Dust mites play a major role in allergic asthma and rhinitis. They occur in environments with  high humidity wherever human skin is found. Dust mites absorb humidity from the atmosphere (ie, they do not drink) and feed on organic matter (including shed human and animal skin). Dust mites are a microscopic type of insect that you cannot see with the naked eye. High levels of dust mites have been detected from mattresses, pillows, carpets, upholstered furniture, bed covers, clothes, soft toys and any woven material. The principal allergen of the dust mite is found in its feces. A gram of dust may contain 1,000 mites and 250,000 fecal particles. Mite antigen is easily measured in the air during house cleaning activities. Dust mites do not bite and do not cause harm to humans, other than by triggering allergies/asthma.  Ways to decrease your exposure to dust mites in your home:  1. Encase mattresses, box springs and pillows with a mite-impermeable barrier or cover  2. Wash sheets, blankets and drapes weekly in hot water (130 F) with detergent and dry them in a dryer on the hot setting.  3. Have the room cleaned frequently with a vacuum cleaner and a damp dust-mop. For carpeting or rugs, vacuuming with a vacuum cleaner equipped with a high-efficiency particulate air (HEPA) filter. The dust mite allergic  individual should not be in a room which is being cleaned and should wait 1 hour after cleaning before going into the room.  4. Do not sleep on upholstered furniture (eg, couches).  5. If possible removing carpeting, upholstered furniture and drapery from the home is ideal. Horizontal blinds should be eliminated in the rooms where the person spends the most time (bedroom, study, television room). Washable vinyl, roller-type shades are optimal.  6. Remove all non-washable stuffed toys from the bedroom. Wash stuffed toys weekly like sheets and blankets above.  7. Reduce indoor humidity to less than 50%. Inexpensive humidity monitors can be purchased at most hardware stores. Do not use a humidifier as  can make the problem worse and are not recommended.

## 2024-06-21 ENCOUNTER — Other Ambulatory Visit: Payer: Self-pay

## 2024-06-21 NOTE — Progress Notes (Signed)
 Specialty Pharmacy Refill Coordination Note  Sara Hayes is a 53 y.o. female assessed today regarding refills of clinic administered specialty medication(s) Tezepelumab -ekko (TEZSPIRE )   Clinic requested Courier to Provider Office   Delivery date: 06/27/24   Verified address: [address] [name], [place] [address]   Medication will be filled on 09.19.25.    Copay: $0.00 Appointment: 09.24.25

## 2024-06-22 ENCOUNTER — Ambulatory Visit (INDEPENDENT_AMBULATORY_CARE_PROVIDER_SITE_OTHER)

## 2024-06-22 DIAGNOSIS — J309 Allergic rhinitis, unspecified: Secondary | ICD-10-CM

## 2024-06-23 ENCOUNTER — Other Ambulatory Visit: Payer: Self-pay

## 2024-06-29 ENCOUNTER — Ambulatory Visit (INDEPENDENT_AMBULATORY_CARE_PROVIDER_SITE_OTHER)

## 2024-06-29 DIAGNOSIS — J4550 Severe persistent asthma, uncomplicated: Secondary | ICD-10-CM

## 2024-06-29 DIAGNOSIS — J309 Allergic rhinitis, unspecified: Secondary | ICD-10-CM

## 2024-07-05 DIAGNOSIS — J45998 Other asthma: Secondary | ICD-10-CM | POA: Diagnosis not present

## 2024-07-06 ENCOUNTER — Ambulatory Visit

## 2024-07-06 DIAGNOSIS — J309 Allergic rhinitis, unspecified: Secondary | ICD-10-CM | POA: Diagnosis not present

## 2024-07-14 ENCOUNTER — Other Ambulatory Visit: Payer: Self-pay

## 2024-07-14 NOTE — Progress Notes (Signed)
 Specialty Pharmacy Refill Coordination Note  Sara Hayes is a 53 y.o. female assessed today regarding refills of clinic administered specialty medication(s) Tezepelumab -ekko (TEZSPIRE )   Clinic requested Courier to Provider Office   Delivery date: 07/20/24   Injection date: 07/27/24  Verified address: [address] [place], [place] [address]   Medication will be filled on 07/19/24.

## 2024-07-27 ENCOUNTER — Ambulatory Visit (INDEPENDENT_AMBULATORY_CARE_PROVIDER_SITE_OTHER)

## 2024-07-27 DIAGNOSIS — J4550 Severe persistent asthma, uncomplicated: Secondary | ICD-10-CM

## 2024-08-02 ENCOUNTER — Ambulatory Visit

## 2024-08-02 DIAGNOSIS — J309 Allergic rhinitis, unspecified: Secondary | ICD-10-CM

## 2024-08-05 DIAGNOSIS — J45998 Other asthma: Secondary | ICD-10-CM | POA: Diagnosis not present

## 2024-08-09 ENCOUNTER — Other Ambulatory Visit (HOSPITAL_COMMUNITY): Payer: Self-pay

## 2024-08-09 ENCOUNTER — Other Ambulatory Visit: Payer: Self-pay

## 2024-08-09 NOTE — Progress Notes (Signed)
 Specialty Pharmacy Refill Coordination Note  Sara Hayes is a 53 y.o. female assessed today regarding refills of clinic administered specialty medication(s) Tezepelumab -ekko (TEZSPIRE )   Clinic requested Courier to Provider Office   Delivery date: 08/18/24   Verified address: [address] [place], [place] [address]   Medication will be filled on: 08/17/24

## 2024-08-17 ENCOUNTER — Other Ambulatory Visit (HOSPITAL_COMMUNITY): Payer: Self-pay

## 2024-08-17 ENCOUNTER — Other Ambulatory Visit: Payer: Self-pay

## 2024-08-24 ENCOUNTER — Ambulatory Visit

## 2024-08-24 DIAGNOSIS — J4550 Severe persistent asthma, uncomplicated: Secondary | ICD-10-CM | POA: Diagnosis not present

## 2024-08-31 ENCOUNTER — Ambulatory Visit (INDEPENDENT_AMBULATORY_CARE_PROVIDER_SITE_OTHER)

## 2024-08-31 DIAGNOSIS — J3089 Other allergic rhinitis: Secondary | ICD-10-CM | POA: Diagnosis not present

## 2024-08-31 DIAGNOSIS — J309 Allergic rhinitis, unspecified: Secondary | ICD-10-CM

## 2024-09-07 ENCOUNTER — Other Ambulatory Visit: Payer: Self-pay

## 2024-09-07 NOTE — Progress Notes (Signed)
 Specialty Pharmacy Refill Coordination Note  Sara Hayes is a 53 y.o. female assessed today regarding refills of clinic administered specialty medication(s) Tezepelumab -ekko (TEZSPIRE )   Clinic requested Courier to Provider Office   Delivery date: 09/19/24   Verified address: [address] [place], [place] [address]   Medication will be filled on: 09/16/24

## 2024-09-12 ENCOUNTER — Other Ambulatory Visit: Payer: Self-pay | Admitting: *Deleted

## 2024-09-12 MED ORDER — PROAIR RESPICLICK 108 (90 BASE) MCG/ACT IN AEPB: INHALATION_SPRAY | RESPIRATORY_TRACT | 2 refills | Status: AC

## 2024-09-16 ENCOUNTER — Other Ambulatory Visit: Payer: Self-pay

## 2024-09-16 ENCOUNTER — Other Ambulatory Visit (HOSPITAL_COMMUNITY): Payer: Self-pay

## 2024-09-21 ENCOUNTER — Ambulatory Visit

## 2024-09-21 DIAGNOSIS — J4550 Severe persistent asthma, uncomplicated: Secondary | ICD-10-CM | POA: Diagnosis not present

## 2024-09-26 ENCOUNTER — Ambulatory Visit

## 2024-09-26 DIAGNOSIS — J3089 Other allergic rhinitis: Secondary | ICD-10-CM | POA: Diagnosis not present

## 2024-09-26 DIAGNOSIS — J309 Allergic rhinitis, unspecified: Secondary | ICD-10-CM | POA: Diagnosis not present

## 2024-10-05 ENCOUNTER — Other Ambulatory Visit: Payer: Self-pay

## 2024-10-10 ENCOUNTER — Other Ambulatory Visit: Payer: Self-pay

## 2024-10-10 ENCOUNTER — Telehealth: Payer: Self-pay | Admitting: Internal Medicine

## 2024-10-10 NOTE — Progress Notes (Signed)
 Specialty Pharmacy Refill Coordination Note  INELL MIMBS is a 54 y.o. female contacted today regarding refills of specialty medication(s) Tezepelumab -ekko (TEZSPIRE )   Patient requested Courier to Provider Office   Delivery date: 10/12/24   Verified address: [address] [place], [place] [address]   Medication will be filled on: 10/11/24

## 2024-10-10 NOTE — Telephone Encounter (Signed)
 Sara Hayes came into the office to see if she get her prescription for Trelegy changed to 3 months at a time vs just one month at a time for insurance purposes. Call her with any questions.

## 2024-10-11 ENCOUNTER — Other Ambulatory Visit: Payer: Self-pay | Admitting: *Deleted

## 2024-10-11 ENCOUNTER — Other Ambulatory Visit: Payer: Self-pay

## 2024-10-11 MED ORDER — TRELEGY ELLIPTA 200-62.5-25 MCG/ACT IN AEPB: 1.0000 | INHALATION_SPRAY | Freq: Every day | RESPIRATORY_TRACT | 1 refills | Status: AC

## 2024-10-11 NOTE — Telephone Encounter (Signed)
 Refills have been sent in for 90 day supply. Called patient and informed, patient verbalized understanding.

## 2024-10-19 ENCOUNTER — Ambulatory Visit

## 2024-10-19 DIAGNOSIS — J4550 Severe persistent asthma, uncomplicated: Secondary | ICD-10-CM | POA: Diagnosis not present

## 2024-10-25 ENCOUNTER — Ambulatory Visit (INDEPENDENT_AMBULATORY_CARE_PROVIDER_SITE_OTHER)

## 2024-10-25 DIAGNOSIS — J302 Other seasonal allergic rhinitis: Secondary | ICD-10-CM

## 2024-10-25 DIAGNOSIS — J3089 Other allergic rhinitis: Secondary | ICD-10-CM

## 2024-10-28 ENCOUNTER — Other Ambulatory Visit: Payer: Self-pay

## 2024-11-07 ENCOUNTER — Other Ambulatory Visit (HOSPITAL_COMMUNITY): Payer: Self-pay

## 2024-11-07 ENCOUNTER — Other Ambulatory Visit: Payer: Self-pay

## 2024-11-07 NOTE — Progress Notes (Signed)
 Specialty Pharmacy Refill Coordination Note  In-office administered. Patient/Guardian authorizes monthly copay charge  Sara Hayes is a 54 y.o. female contacted today regarding refills of specialty medication(s) Tezepelumab -ekko (TEZSPIRE )  Injection appointment: 11/16/24  Patient requested: Courier to Provider Office   Delivery date: 11/10/24   Verified address: [facility] [address] [place] [address]  Medication will be filled on 11/09/24 .

## 2024-11-09 ENCOUNTER — Other Ambulatory Visit: Payer: Self-pay

## 2024-11-16 ENCOUNTER — Ambulatory Visit

## 2024-12-05 ENCOUNTER — Ambulatory Visit: Admitting: Internal Medicine
